# Patient Record
Sex: Female | Born: 1975 | Race: White | Hispanic: No | Smoking: Current some day smoker
Health system: Southern US, Community
[De-identification: ages and names within clinical notes are randomized; demographics above are authoritative.]

## PROBLEM LIST (undated history)

## (undated) DIAGNOSIS — F410 Panic disorder [episodic paroxysmal anxiety] without agoraphobia: Secondary | ICD-10-CM

## (undated) DIAGNOSIS — F4310 Post-traumatic stress disorder, unspecified: Secondary | ICD-10-CM

## (undated) DIAGNOSIS — F319 Bipolar disorder, unspecified: Secondary | ICD-10-CM

## (undated) DIAGNOSIS — G8929 Other chronic pain: Secondary | ICD-10-CM

## (undated) DIAGNOSIS — M549 Dorsalgia, unspecified: Secondary | ICD-10-CM

## (undated) DIAGNOSIS — F419 Anxiety disorder, unspecified: Secondary | ICD-10-CM

## (undated) DIAGNOSIS — F1120 Opioid dependence, uncomplicated: Secondary | ICD-10-CM

## (undated) HISTORY — PX: CHOLECYSTECTOMY: SHX55

## (undated) HISTORY — PX: APPENDECTOMY: SHX54

## (undated) HISTORY — PX: ABDOMINAL HYSTERECTOMY: SHX81

---

## 1998-06-04 ENCOUNTER — Other Ambulatory Visit: Admission: RE | Admit: 1998-06-04 | Discharge: 1998-06-04 | Payer: Self-pay | Admitting: Obstetrics and Gynecology

## 1998-10-25 ENCOUNTER — Ambulatory Visit (HOSPITAL_COMMUNITY): Admission: RE | Admit: 1998-10-25 | Discharge: 1998-10-25 | Payer: Self-pay | Admitting: Obstetrics and Gynecology

## 2001-07-18 ENCOUNTER — Other Ambulatory Visit: Admission: RE | Admit: 2001-07-18 | Discharge: 2001-07-18 | Payer: Self-pay | Admitting: Family Medicine

## 2012-05-07 ENCOUNTER — Encounter (HOSPITAL_BASED_OUTPATIENT_CLINIC_OR_DEPARTMENT_OTHER): Payer: Self-pay | Admitting: *Deleted

## 2012-05-07 ENCOUNTER — Emergency Department (HOSPITAL_BASED_OUTPATIENT_CLINIC_OR_DEPARTMENT_OTHER)
Admission: EM | Admit: 2012-05-07 | Discharge: 2012-05-07 | Disposition: A | Discharge status: Home / Self Care | Payer: Self-pay | Attending: Emergency Medicine | Admitting: Emergency Medicine

## 2012-05-07 DIAGNOSIS — N390 Urinary tract infection, site not specified: Secondary | ICD-10-CM | POA: Insufficient documentation

## 2012-05-07 DIAGNOSIS — F17200 Nicotine dependence, unspecified, uncomplicated: Secondary | ICD-10-CM | POA: Insufficient documentation

## 2012-05-07 DIAGNOSIS — Z3202 Encounter for pregnancy test, result negative: Secondary | ICD-10-CM | POA: Insufficient documentation

## 2012-05-07 DIAGNOSIS — Z90710 Acquired absence of both cervix and uterus: Secondary | ICD-10-CM | POA: Insufficient documentation

## 2012-05-07 LAB — URINALYSIS, ROUTINE W REFLEX MICROSCOPIC
Bilirubin Urine: NEGATIVE
Nitrite: POSITIVE — AB
Specific Gravity, Urine: 1.017 (ref 1.005–1.030)
Urobilinogen, UA: 2 mg/dL — ABNORMAL HIGH (ref 0.0–1.0)

## 2012-05-07 LAB — URINE MICROSCOPIC-ADD ON

## 2012-05-07 MED ORDER — OXYCODONE-ACETAMINOPHEN 5-325 MG PO TABS
2.0000 | ORAL_TABLET | Freq: Once | ORAL | Status: AC
  Administered 2012-05-07: 2 via ORAL
  Filled 2012-05-07 (×2): qty 2

## 2012-05-07 MED ORDER — HYDROCODONE-ACETAMINOPHEN 5-325 MG PO TABS: 2.0000 | ORAL_TABLET | ORAL | Status: DC | PRN

## 2012-05-07 MED ORDER — CIPROFLOXACIN HCL 500 MG PO TABS: 500.0000 mg | ORAL_TABLET | Freq: Two times a day (BID) | ORAL | Status: DC

## 2012-05-07 NOTE — ED Notes (Addendum)
The patient stated that she took Phenazopyridine HCI  To help with the her discomfort. Patient states it started last night, burning when urinating, lower back pain, pelvic pain even not when urinating

## 2012-05-07 NOTE — ED Provider Notes (Signed)
History     CSN: [id number]  Arrival date & time 05/07/12  4098   First MD Initiated Contact with Patient 05/07/12 [phone]      Chief Complaint  Patient presents with  . Urinary Tract Infection    (Consider location/radiation/quality/duration/timing/severity/associated sxs/prior treatment) HPI Comments: Patient presents with a two-day history of lower gone on discomfort and burning on urination. She states hours through the night. She denies any vaginal bleeding or discharge. She denies any fevers or chills. She's had some nausea but no vomiting. She has a history of UTIs in the past and says it is still similar. She says the pain is radiating to her lower back bilaterally. She describes as a constant throbbing pain.   History reviewed. No pertinent past medical history.  Past Surgical History  Procedure Date  . Cesarean section   . Abdominal hysterectomy     No family history on file.  History  Substance Use Topics  . Smoking status: Current Some Day Smoker  . Smokeless tobacco: Not on file  . Alcohol Use: No    OB History    Grav Para Term Preterm Abortions TAB SAB Ect Mult Living                  Review of Systems  Constitutional: Negative for fever, chills, diaphoresis and fatigue.  HENT: Negative for congestion, rhinorrhea and sneezing.   Eyes: Negative.   Respiratory: Negative for cough, chest tightness and shortness of breath.   Cardiovascular: Negative for chest pain and leg swelling.  Gastrointestinal: Positive for nausea and abdominal pain. Negative for vomiting, diarrhea and blood in stool.  Genitourinary: Positive for dysuria, urgency, frequency and flank pain. Negative for hematuria, vaginal bleeding, vaginal discharge and difficulty urinating.  Musculoskeletal: Negative for back pain and arthralgias.  Skin: Negative for rash.  Neurological: Negative for dizziness, speech difficulty, weakness, numbness and headaches.    Allergies  Review of patient's  allergies indicates no known allergies.  Home Medications   Current Outpatient Rx  Name  Route  Sig  Dispense  Refill  . CIPROFLOXACIN HCL 500 MG PO TABS   Oral   Take 1 tablet (500 mg total) by mouth 2 (two) times daily.   14 tablet   0   . HYDROCODONE-ACETAMINOPHEN 5-325 MG PO TABS   Oral   Take 2 tablets by mouth every 4 (four) hours as needed for pain.   15 tablet   0     BP 118/70  Pulse 82  Temp 97.7 F (36.5 C) (Oral)  Resp 20  Ht 5\' 6"  (1.676 m)  Wt 122 lb (55.339 kg)  BMI 19.69 kg/m2  SpO2 100%  Physical Exam  Constitutional: She is oriented to person, place, and time. She appears well-developed and well-nourished.  HENT:  Head: Normocephalic and atraumatic.  Eyes: Pupils are equal, round, and reactive to light.  Neck: Normal range of motion. Neck supple.  Cardiovascular: Normal rate, regular rhythm and normal heart sounds.   Pulmonary/Chest: Effort normal and breath sounds normal. No respiratory distress. She has no wheezes. She has no rales. She exhibits no tenderness.  Abdominal: Soft. Bowel sounds are normal. There is tenderness (Mild tenderness in the suprapubic region, no CVA tenderness). There is no rebound and no guarding.  Musculoskeletal: Normal range of motion. She exhibits no edema.  Lymphadenopathy:    She has no cervical adenopathy.  Neurological: She is alert and oriented to person, place, and time.  Skin: Skin is warm  and dry. No rash noted.  Psychiatric: She has a normal mood and affect.    ED Course  Procedures (including critical care time)  Results for orders placed during the hospital encounter of 05/07/12  URINALYSIS, ROUTINE W REFLEX MICROSCOPIC      Component Value Range   Color, Urine ORANGE (*) YELLOW   APPearance CLOUDY (*) CLEAR   Specific Gravity, Urine 1.017  1.005 - 1.030   pH 5.0  5.0 - 8.0   Glucose, UA NEGATIVE  NEGATIVE mg/dL   Hgb urine dipstick NEGATIVE  NEGATIVE   Bilirubin Urine NEGATIVE  NEGATIVE   Ketones,  ur 15 (*) NEGATIVE mg/dL   Protein, ur NEGATIVE  NEGATIVE mg/dL   Urobilinogen, UA 2.0 (*) 0.0 - 1.0 mg/dL   Nitrite POSITIVE (*) NEGATIVE   Leukocytes, UA LARGE (*) NEGATIVE  PREGNANCY, URINE      Component Value Range   Preg Test, Ur NEGATIVE  NEGATIVE  URINE MICROSCOPIC-ADD ON      Component Value Range   Squamous Epithelial / LPF FEW (*) RARE   WBC, UA TOO NUMEROUS TO COUNT  <3 WBC/hpf   RBC / HPF 0-2  <3 RBC/hpf   Bacteria, UA MANY (*) RARE   No results found.    1. UTI (lower urinary tract infection)       MDM  Patient with UTI. Will start on Cipro and Vicodin for pain. Advised her to followup with her primary care physician or return here as needed for any ongoing or worsening symptoms.        Rolan Bucco, MD 05/07/12 [phone]

## 2012-05-09 ENCOUNTER — Emergency Department (HOSPITAL_BASED_OUTPATIENT_CLINIC_OR_DEPARTMENT_OTHER)
Admission: EM | Admit: 2012-05-09 | Discharge: 2012-05-09 | Disposition: A | Discharge status: Home / Self Care | Payer: Self-pay | Attending: Emergency Medicine | Admitting: Emergency Medicine

## 2012-05-09 ENCOUNTER — Encounter (HOSPITAL_BASED_OUTPATIENT_CLINIC_OR_DEPARTMENT_OTHER): Payer: Self-pay | Admitting: *Deleted

## 2012-05-09 DIAGNOSIS — R112 Nausea with vomiting, unspecified: Secondary | ICD-10-CM | POA: Insufficient documentation

## 2012-05-09 DIAGNOSIS — Z87440 Personal history of urinary (tract) infections: Secondary | ICD-10-CM | POA: Insufficient documentation

## 2012-05-09 DIAGNOSIS — F17200 Nicotine dependence, unspecified, uncomplicated: Secondary | ICD-10-CM | POA: Insufficient documentation

## 2012-05-09 DIAGNOSIS — M545 Low back pain, unspecified: Secondary | ICD-10-CM | POA: Insufficient documentation

## 2012-05-09 DIAGNOSIS — G8929 Other chronic pain: Secondary | ICD-10-CM | POA: Insufficient documentation

## 2012-05-09 DIAGNOSIS — Z90710 Acquired absence of both cervix and uterus: Secondary | ICD-10-CM | POA: Insufficient documentation

## 2012-05-09 DIAGNOSIS — Z3202 Encounter for pregnancy test, result negative: Secondary | ICD-10-CM | POA: Insufficient documentation

## 2012-05-09 LAB — URINE CULTURE
Colony Count: NO GROWTH
Culture: NO GROWTH

## 2012-05-09 LAB — URINALYSIS, ROUTINE W REFLEX MICROSCOPIC
Bilirubin Urine: NEGATIVE
Hgb urine dipstick: NEGATIVE
Ketones, ur: NEGATIVE mg/dL
Protein, ur: NEGATIVE mg/dL
Urobilinogen, UA: 0.2 mg/dL (ref 0.0–1.0)

## 2012-05-09 LAB — PREGNANCY, URINE: Preg Test, Ur: NEGATIVE

## 2012-05-09 MED ORDER — HYDROCODONE-ACETAMINOPHEN 5-325 MG PO TABS: 1.0000 | ORAL_TABLET | Freq: Four times a day (QID) | ORAL | Status: DC | PRN

## 2012-05-09 NOTE — ED Notes (Signed)
Pt. Reports being seen here 2 days ago and received meds for urinary tract infection and pain meds.  Pt. Reports being out of pain meds with c/o low back pain and had to leave work early today.

## 2012-05-09 NOTE — ED Provider Notes (Signed)
History   This chart was scribed for Hurman Horn, MD by Leone Payor, ED Scribe. This patient was seen in room MHOTF/OTF and the patient's care was started at 1616.   CSN: [id number]  Arrival date & time 05/09/12  1444   First MD Initiated Contact with Patient 05/09/12 1616      Chief Complaint  Patient presents with  . Back Pain     The history is provided by the patient. No language interpreter was used.    Beth Best is a 37 y.o. female who presents to the Emergency Department complaining of unchanged, constant back pain that is associated with recurrent UTI's starting 6 months ago. Was seen here a few days ago and was treated for a UTI. She states she has a h/o of UTI's. Back pain is worse with movement and sometimes radiates to the back of legs but does not go past knees and sometimes radiates to neck. It is worsened with position changes and regular UTI's. Pt is currently on Ciprofloxacin. She has associated vomiting, nausea. She denies rash, diarrhea, chest pain, SOB, vaginal pain, vaginal bleeding, visual changes, change in bowel or bladder function.    Pt has h/o abdominal hysterectomy.  Pt is a current everyday smoker but denies alcohol use.  History reviewed. No pertinent past medical history.  Past Surgical History  Procedure Date  . Cesarean section   . Abdominal hysterectomy     No family history on file.  History  Substance Use Topics  . Smoking status: Current Some Day Smoker  . Smokeless tobacco: Not on file  . Alcohol Use: No    OB History    Grav Para Term Preterm Abortions TAB SAB Ect Mult Living                  Review of Systems  10 Systems reviewed and all are negative for acute change except as noted in the HPI.    Allergies  Review of patient's allergies indicates no known allergies.  Home Medications   Current Outpatient Rx  Name  Route  Sig  Dispense  Refill  . CIPROFLOXACIN HCL 500 MG PO TABS   Oral   Take 1 tablet (500  mg total) by mouth 2 (two) times daily.   14 tablet   0   . HYDROCODONE-ACETAMINOPHEN 5-325 MG PO TABS   Oral   Take 1 tablet by mouth every 6 (six) hours as needed for pain.   12 tablet   0     BP 123/83  Pulse 91  Temp 98.4 F (36.9 C) (Oral)  Resp 20  SpO2 100%  Physical Exam  Nursing note and vitals reviewed. Constitutional:       Awake, alert, nontoxic appearance with baseline speech.  HENT:  Head: Atraumatic.  Eyes: Pupils are equal, round, and reactive to light. Right eye exhibits no discharge. Left eye exhibits no discharge.  Neck: Neck supple.  Cardiovascular: Normal rate and regular rhythm.   No murmur heard. Pulmonary/Chest: Effort normal and breath sounds normal. No respiratory distress. She has no wheezes. She has no rales. She exhibits no tenderness.  Abdominal: Soft. Bowel sounds are normal. She exhibits no mass. There is no tenderness. There is no rebound.  Musculoskeletal:       Thoracic back: She exhibits no tenderness.       Lumbar back: She exhibits no tenderness.       Bilateral lower extremities non tender without new rashes or  color change, baseline ROM with intact DP / PT pulses, CR<2 secs all digits bilaterally, sensation baseline light touch bilaterally for pt, DTR's symmetric and intact bilaterally KJ / AJ, motor symmetric bilateral 5 / 5 hip flexion, quadriceps, hamstrings, EHL, foot dorsiflexion, foot plantarflexion, gait somewhat antalgic but without apparent new ataxia.  Diffuse bilateral para lumbar tenderness with palpation. Not CVA tenderness.   Neurological:       Mental status baseline for patient.  Upper extremity motor strength and sensation intact and symmetric bilaterally.  Skin: No rash noted.  Psychiatric: She has a normal mood and affect.    ED Course  Procedures (including critical care time)  DIAGNOSTIC STUDIES: Oxygen Saturation is 100% on room air, normal by my interpretation.    COORDINATION OF CARE:  4:27PM Patient /  Family / Caregiver understand and agree with initial ED impression and plan with expectations set for ED visit.   Labs Reviewed  URINALYSIS, ROUTINE W REFLEX MICROSCOPIC - Abnormal; Notable for the following:    Specific Gravity, Urine 1.003 (*)     All other components within normal limits  PREGNANCY, URINE   No results found.   1. Chronic lumbar pain       MDM  I personally performed the services described in this documentation, which was scribed in my presence. The recorded information has been reviewed and is accurate.  Patient / Family / Caregiver informed of clinical course, understand medical decision-making process, and agree with plan.  I doubt any other EMC precluding discharge at this time including, but not necessarily limited to the following: SBI, cauda equina.  Hurman Horn, MD 05/10/12 [phone]

## 2012-05-09 NOTE — ED Notes (Signed)
Was here a few days ago and treated for a UTI. Pain is no better.

## 2012-05-12 ENCOUNTER — Emergency Department (HOSPITAL_BASED_OUTPATIENT_CLINIC_OR_DEPARTMENT_OTHER)
Admission: EM | Admit: 2012-05-12 | Discharge: 2012-05-12 | Disposition: A | Discharge status: Home / Self Care | Payer: Self-pay | Attending: Emergency Medicine | Admitting: Emergency Medicine

## 2012-05-12 ENCOUNTER — Encounter (HOSPITAL_BASED_OUTPATIENT_CLINIC_OR_DEPARTMENT_OTHER): Payer: Self-pay | Admitting: *Deleted

## 2012-05-12 DIAGNOSIS — F17200 Nicotine dependence, unspecified, uncomplicated: Secondary | ICD-10-CM | POA: Insufficient documentation

## 2012-05-12 DIAGNOSIS — K649 Unspecified hemorrhoids: Secondary | ICD-10-CM | POA: Insufficient documentation

## 2012-05-12 MED ORDER — HYDROCORTISONE ACE-PRAMOXINE 1-1 % RE FOAM: 1.0000 | Freq: Two times a day (BID) | RECTAL | Status: DC

## 2012-05-12 NOTE — ED Provider Notes (Signed)
History     CSN: [id number]  Arrival date & time 05/12/12  1721   First MD Initiated Contact with Patient 05/12/12 1810      Chief Complaint  Patient presents with  . Hemorrhoids    (Consider location/radiation/quality/duration/timing/severity/associated sxs/prior treatment) HPI Comments: Pt states that she has had a hemorrhoid for 3 days:pt states that she has no history:pt denies fever:pt has not tried any medications  The history is provided by the patient. No language interpreter was used.    History reviewed. No pertinent past medical history.  Past Surgical History  Procedure Date  . Cesarean section   . Abdominal hysterectomy     History reviewed. No pertinent family history.  History  Substance Use Topics  . Smoking status: Current Some Day Smoker  . Smokeless tobacco: Not on file  . Alcohol Use: No    OB History    Grav Para Term Preterm Abortions TAB SAB Ect Mult Living                  Review of Systems  Constitutional: Negative.   Respiratory: Negative.   Cardiovascular: Negative.     Allergies  Review of patient's allergies indicates no known allergies.  Home Medications   Current Outpatient Rx  Name  Route  Sig  Dispense  Refill  . CIPROFLOXACIN HCL 500 MG PO TABS   Oral   Take 1 tablet (500 mg total) by mouth 2 (two) times daily.   14 tablet   0   . HYDROCODONE-ACETAMINOPHEN 5-325 MG PO TABS   Oral   Take 1 tablet by mouth every 6 (six) hours as needed for pain.   12 tablet   0   . HYDROCORTISONE ACE-PRAMOXINE 1-1 % RE FOAM   Rectal   Place 1 applicator rectally 2 (two) times daily.   10 g   0     BP 124/74  Pulse 85  Temp 98.8 F (37.1 C) (Oral)  Resp 16  Ht 5\' 6"  (1.676 m)  Wt 122 lb (55.339 kg)  BMI 19.69 kg/m2  SpO2 100%  Physical Exam  Nursing note and vitals reviewed. Cardiovascular: Normal rate and regular rhythm.   Pulmonary/Chest: Effort normal and breath sounds normal.  Genitourinary:       hemorrhoid  noted    ED Course  Procedures (including critical care time)  Labs Reviewed - No data to display No results found.   1. Hemorrhoid       MDM  Will treat with protofoam        Teressa Lower, NP 05/12/12 1926

## 2012-05-12 NOTE — ED Notes (Signed)
Pt c/o Hemorids  X 3 days

## 2012-05-13 NOTE — ED Provider Notes (Signed)
Medical screening examination/treatment/procedure(s) were performed by non-physician practitioner and as supervising physician I was immediately available for consultation/collaboration.     Manvi Guilliams R Sanya Kobrin, MD 05/13/12 0002 

## 2012-07-04 ENCOUNTER — Encounter (HOSPITAL_BASED_OUTPATIENT_CLINIC_OR_DEPARTMENT_OTHER): Payer: Self-pay | Admitting: *Deleted

## 2012-07-04 ENCOUNTER — Emergency Department (HOSPITAL_BASED_OUTPATIENT_CLINIC_OR_DEPARTMENT_OTHER)
Admission: EM | Admit: 2012-07-04 | Discharge: 2012-07-04 | Disposition: A | Discharge status: Home / Self Care | Payer: Self-pay | Attending: Emergency Medicine | Admitting: Emergency Medicine

## 2012-07-04 ENCOUNTER — Emergency Department (HOSPITAL_BASED_OUTPATIENT_CLINIC_OR_DEPARTMENT_OTHER): Payer: Self-pay

## 2012-07-04 DIAGNOSIS — M25531 Pain in right wrist: Secondary | ICD-10-CM

## 2012-07-04 DIAGNOSIS — R296 Repeated falls: Secondary | ICD-10-CM | POA: Insufficient documentation

## 2012-07-04 DIAGNOSIS — Y929 Unspecified place or not applicable: Secondary | ICD-10-CM | POA: Insufficient documentation

## 2012-07-04 DIAGNOSIS — S46909A Unspecified injury of unspecified muscle, fascia and tendon at shoulder and upper arm level, unspecified arm, initial encounter: Secondary | ICD-10-CM | POA: Insufficient documentation

## 2012-07-04 DIAGNOSIS — F17200 Nicotine dependence, unspecified, uncomplicated: Secondary | ICD-10-CM | POA: Insufficient documentation

## 2012-07-04 DIAGNOSIS — M25511 Pain in right shoulder: Secondary | ICD-10-CM

## 2012-07-04 DIAGNOSIS — S6990XA Unspecified injury of unspecified wrist, hand and finger(s), initial encounter: Secondary | ICD-10-CM | POA: Insufficient documentation

## 2012-07-04 DIAGNOSIS — Y9389 Activity, other specified: Secondary | ICD-10-CM | POA: Insufficient documentation

## 2012-07-04 DIAGNOSIS — S59909A Unspecified injury of unspecified elbow, initial encounter: Secondary | ICD-10-CM | POA: Insufficient documentation

## 2012-07-04 MED ORDER — IBUPROFEN 800 MG PO TABS
800.0000 mg | ORAL_TABLET | Freq: Once | ORAL | Status: AC
  Administered 2012-07-04: 800 mg via ORAL
  Filled 2012-07-04: qty 1

## 2012-07-04 NOTE — ED Notes (Signed)
Fell in the tub 2 days ago. Pain in her right wrist and right shoulder. Drove herself here.

## 2012-07-04 NOTE — ED Provider Notes (Signed)
History     CSN: [id number]  Arrival date & time 07/04/12  1041   First MD Initiated Contact with Patient 07/04/12 1047      Chief Complaint  Patient presents with  . Fall    (Consider location/radiation/quality/duration/timing/severity/associated sxs/prior treatment) HPI  Patient states she fell approximately 36 hours prior to coming into the ED. She states she fell in the bathtub. She denied strike her head. She has pain in her right wrist and right shoulder. She describes the pain as severe. She has taken over-the-counter medicine without relief. Denies any numbness or tingling. She denies any vision changes, chest pain, abdominal pain, nausea, vomiting, diarrhea, or lower extremity pain. She is right-handed. She describes pain worsening with extension of the right wrist.  History reviewed. No pertinent past medical history.  Past Surgical History  Procedure Laterality Date  . Cesarean section    . Abdominal hysterectomy      History reviewed. No pertinent family history.  History  Substance Use Topics  . Smoking status: Current Some Day Smoker    Types: Cigarettes  . Smokeless tobacco: Not on file  . Alcohol Use: No    OB History   Grav Para Term Preterm Abortions TAB SAB Ect Mult Living                  Review of Systems  All other systems reviewed and are negative.    Allergies  Review of patient's allergies indicates no known allergies.  Home Medications   Current Outpatient Rx  Name  Route  Sig  Dispense  Refill  . ciprofloxacin (CIPRO) 500 MG tablet   Oral   Take 1 tablet (500 mg total) by mouth 2 (two) times daily.   14 tablet   0   . HYDROcodone-acetaminophen (NORCO/VICODIN) 5-325 MG per tablet   Oral   Take 1 tablet by mouth every 6 (six) hours as needed for pain.   12 tablet   0   . hydrocortisone-pramoxine (PROCTOFOAM HC) rectal foam   Rectal   Place 1 applicator rectally 2 (two) times daily.   10 g   0     BP 145/95  Pulse 98   Temp(Src) 98.3 F (36.8 C) (Oral)  Resp 18  SpO2 100%  Physical Exam  Nursing note and vitals reviewed. Constitutional: She is oriented to person, place, and time. She appears well-developed and well-nourished.  HENT:  Head: Normocephalic and atraumatic.  Eyes: Conjunctivae and EOM are normal. Pupils are equal, round, and reactive to light.  Neck: Normal range of motion. Neck supple.  Cardiovascular: Normal rate, regular rhythm, normal heart sounds and intact distal pulses.   Pulmonary/Chest: Effort normal and breath sounds normal.  Abdominal: Soft. Bowel sounds are normal.  Musculoskeletal: Normal range of motion.  Tender right wrist on the dorsal aspect. Tender to the lateral aspect of the right elbow. She has some posterior tenderness of her right shoulder. She also has some tenderness over her cervical spine. She has full active range of movement of all of the above. There is no neurosensory deficit. Pulses are intact. The skin is intact with no signs of contusion or trauma.  Neurological: She is alert and oriented to person, place, and time.  Skin: Skin is warm and dry.  Psychiatric: She has a normal mood and affect. Thought content normal.    ED Course  Procedures (including critical care time)  Labs Reviewed - No data to display Dg Cervical Spine Complete  07/04/2012  *  RADIOLOGY REPORT*  Clinical Data: Fall  CERVICAL SPINE - COMPLETE 4+ VIEW  Comparison: None.  Findings: Normal alignment and no fracture.  Mild disc space narrowing and early spurring C5-6 and C6-7.  IMPRESSION: Negative for fracture.   Original Report Authenticated By: Janeece Riggers, M.D.    Dg Shoulder Right  07/04/2012  *RADIOLOGY REPORT*  Clinical Data: Fall  RIGHT SHOULDER - 2+ VIEW  Comparison:  None.  Findings:  There is no evidence of fracture or dislocation.  There is no evidence of arthropathy or other focal bone abnormality. Soft tissues are unremarkable.  IMPRESSION: Negative.   Original Report  Authenticated By: Janeece Riggers, M.D.    Dg Elbow Complete Right  07/04/2012  *RADIOLOGY REPORT*  Clinical Data: Fall  RIGHT ELBOW - COMPLETE 3+ VIEW  Comparison:  None.  Findings:  There is no evidence of fracture, dislocation, or joint effusion.  There is no evidence of arthropathy or other focal bone abnormality.  Soft tissues are unremarkable.  IMPRESSION: Negative.   Original Report Authenticated By: Janeece Riggers, M.D.    Dg Wrist Complete Right  07/04/2012  *RADIOLOGY REPORT*  Clinical Data: Fall  RIGHT WRIST - COMPLETE 3+ VIEW  Comparison:  None.  Findings:  There is no evidence of fracture or dislocation.  There is no evidence of arthropathy or other focal bone abnormality. Soft tissues are unremarkable.  IMPRESSION: Negative.   Original Report Authenticated By: Janeece Riggers, M.D.      No diagnosis found.    MDM  Patient will have left wrist splint placed. She is given referral to Dr. Pearletha Forge. She had two pain medicine prescriptions dispensed in January for ed complaints. I have discussed conservative pain management treatment with her. She'll be given a referral to followup for followup       Hilario Quarry, MD 07/04/12 1243

## 2012-08-05 ENCOUNTER — Emergency Department (HOSPITAL_BASED_OUTPATIENT_CLINIC_OR_DEPARTMENT_OTHER): Payer: Self-pay

## 2012-08-05 ENCOUNTER — Encounter (HOSPITAL_BASED_OUTPATIENT_CLINIC_OR_DEPARTMENT_OTHER): Payer: Self-pay | Admitting: Emergency Medicine

## 2012-08-05 ENCOUNTER — Emergency Department (HOSPITAL_BASED_OUTPATIENT_CLINIC_OR_DEPARTMENT_OTHER)
Admission: EM | Admit: 2012-08-05 | Discharge: 2012-08-05 | Disposition: A | Discharge status: Home / Self Care | Payer: Self-pay | Attending: Emergency Medicine | Admitting: Emergency Medicine

## 2012-08-05 DIAGNOSIS — W1809XA Striking against other object with subsequent fall, initial encounter: Secondary | ICD-10-CM | POA: Insufficient documentation

## 2012-08-05 DIAGNOSIS — G8929 Other chronic pain: Secondary | ICD-10-CM | POA: Insufficient documentation

## 2012-08-05 DIAGNOSIS — W19XXXA Unspecified fall, initial encounter: Secondary | ICD-10-CM | POA: Insufficient documentation

## 2012-08-05 DIAGNOSIS — Y92009 Unspecified place in unspecified non-institutional (private) residence as the place of occurrence of the external cause: Secondary | ICD-10-CM | POA: Insufficient documentation

## 2012-08-05 DIAGNOSIS — F410 Panic disorder [episodic paroxysmal anxiety] without agoraphobia: Secondary | ICD-10-CM | POA: Insufficient documentation

## 2012-08-05 DIAGNOSIS — F17200 Nicotine dependence, unspecified, uncomplicated: Secondary | ICD-10-CM | POA: Insufficient documentation

## 2012-08-05 DIAGNOSIS — Y9301 Activity, walking, marching and hiking: Secondary | ICD-10-CM | POA: Insufficient documentation

## 2012-08-05 DIAGNOSIS — Z79899 Other long term (current) drug therapy: Secondary | ICD-10-CM | POA: Insufficient documentation

## 2012-08-05 DIAGNOSIS — S0990XA Unspecified injury of head, initial encounter: Secondary | ICD-10-CM | POA: Insufficient documentation

## 2012-08-05 DIAGNOSIS — R42 Dizziness and giddiness: Secondary | ICD-10-CM | POA: Insufficient documentation

## 2012-08-05 HISTORY — DX: Panic disorder (episodic paroxysmal anxiety): F41.0

## 2012-08-05 HISTORY — DX: Dorsalgia, unspecified: M54.9

## 2012-08-05 HISTORY — DX: Anxiety disorder, unspecified: F41.9

## 2012-08-05 HISTORY — DX: Other chronic pain: G89.29

## 2012-08-05 LAB — CBC WITH DIFFERENTIAL/PLATELET
Basophils Absolute: 0 10*3/uL (ref 0.0–0.1)
Eosinophils Absolute: 0.2 10*3/uL (ref 0.0–0.7)
Lymphocytes Relative: 31 % (ref 12–46)
Lymphs Abs: 2.6 10*3/uL (ref 0.7–4.0)
MCH: 31.6 pg (ref 26.0–34.0)
Neutrophils Relative %: 58 % (ref 43–77)
Platelets: 161 10*3/uL (ref 150–400)
RBC: 4.3 MIL/uL (ref 3.87–5.11)
RDW: 12.1 % (ref 11.5–15.5)
WBC: 8.4 10*3/uL (ref 4.0–10.5)

## 2012-08-05 LAB — RAPID URINE DRUG SCREEN, HOSP PERFORMED
Amphetamines: NOT DETECTED
Barbiturates: NOT DETECTED
Benzodiazepines: POSITIVE — AB
Cocaine: NOT DETECTED

## 2012-08-05 LAB — BASIC METABOLIC PANEL
Calcium: 9 mg/dL (ref 8.4–10.5)
Creatinine, Ser: 0.8 mg/dL (ref 0.50–1.10)
GFR calc non Af Amer: >90 mL/min (ref >90)
Sodium: 139 mEq/L (ref 135–145)

## 2012-08-05 LAB — URINALYSIS, ROUTINE W REFLEX MICROSCOPIC
Bilirubin Urine: NEGATIVE
Glucose, UA: NEGATIVE mg/dL
Hgb urine dipstick: NEGATIVE
Ketones, ur: NEGATIVE mg/dL
pH: 5 (ref 5.0–8.0)

## 2012-08-05 LAB — URINE MICROSCOPIC-ADD ON

## 2012-08-05 MED ORDER — KETOROLAC TROMETHAMINE 30 MG/ML IJ SOLN
30.0000 mg | Freq: Once | INTRAMUSCULAR | Status: AC
  Administered 2012-08-05: 30 mg via INTRAVENOUS
  Filled 2012-08-05: qty 1

## 2012-08-05 NOTE — ED Notes (Signed)
Patient and female visitor in room appear to be sedated, sleepy with red glassy eye.  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

## 2012-08-05 NOTE — ED Notes (Signed)
Pt. thinks she fell and hit her head after having a panic attack.  Pt c/o h/a.  Difficult to obtain information from pt due to her slow response.  Fiance with pt. states he found her "flat on her back on the floor around 12:45".  He thought she was having a seizure.  He placed his finger in her mouth and "she came around some, but hasn't made much sense".

## 2012-08-05 NOTE — ED Provider Notes (Signed)
History     CSN: [id number]  Arrival date & time 08/05/12  1403   First MD Initiated Contact with Patient 08/05/12 1416      Chief Complaint  Patient presents with  . Fall  . Panic Attack    (Consider location/radiation/quality/duration/timing/severity/associated sxs/prior treatment) HPI Comments: Pt state that she got dizzy and fell in her living:pt states that she hit her head:boyfriend states that he heard her fall and then he went in the room and she was flat on her back:pt states that she is able to ambulate after fall but just has a bad headache:pt states that she remembers the whole event  Patient is a 37 y.o. female presenting with fall. The history is provided by the patient. No language interpreter was used.  Fall The accident occurred 1 to 2 hours ago. The fall occurred while walking. She landed on a hard floor. The point of impact was the head. The pain is present in the head and neck. The pain is severe. She was ambulatory at the scene. There was no entrapment after the fall. There was no drug use involved in the accident. There was no alcohol use involved in the accident. Pertinent negatives include no visual change, no fever, no bowel incontinence, no nausea, no vomiting, no hematuria, no loss of consciousness and no tingling. The symptoms are aggravated by activity. She has tried nothing for the symptoms.    Past Medical History  Diagnosis Date  . Panic attacks   . Anxiety   . Chronic back pain     Past Surgical History  Procedure Laterality Date  . Cesarean section    . Abdominal hysterectomy      No family history on file.  History  Substance Use Topics  . Smoking status: Current Some Day Smoker    Types: Cigarettes  . Smokeless tobacco: Not on file  . Alcohol Use: No    OB History   Grav Para Term Preterm Abortions TAB SAB Ect Mult Living                  Review of Systems  Constitutional: Negative for fever.  Respiratory: Negative.    Cardiovascular: Negative.   Gastrointestinal: Negative for nausea, vomiting and bowel incontinence.  Genitourinary: Negative for hematuria.  Neurological: Negative for tingling and loss of consciousness.    Allergies  Review of patient's allergies indicates no known allergies.  Home Medications   Current Outpatient Rx  Name  Route  Sig  Dispense  Refill  . FLUoxetine (PROZAC) 20 MG tablet   Oral   Take 20 mg by mouth daily.         . ciprofloxacin (CIPRO) 500 MG tablet   Oral   Take 1 tablet (500 mg total) by mouth 2 (two) times daily.   14 tablet   0   . HYDROcodone-acetaminophen (NORCO/VICODIN) 5-325 MG per tablet   Oral   Take 1 tablet by mouth every 6 (six) hours as needed for pain.   12 tablet   0   . hydrocortisone-pramoxine (PROCTOFOAM HC) rectal foam   Rectal   Place 1 applicator rectally 2 (two) times daily.   10 g   0     BP 114/91  Pulse 97  Temp(Src) 98.2 F (36.8 C) (Oral)  Resp 16  Ht 5\' 6"  (1.676 m)  Wt 120 lb (54.432 kg)  BMI 19.38 kg/m2  SpO2 97%  Physical Exam  Vitals reviewed. Constitutional: She is oriented to person,  place, and time. She appears well-developed and well-nourished.  HENT:  Head: Normocephalic and atraumatic.  Eyes: Conjunctivae and EOM are normal. Pupils are equal, round, and reactive to light.  Neck: Normal range of motion. Neck supple.  Cardiovascular: Normal rate and regular rhythm.   Pulmonary/Chest: Effort normal and breath sounds normal.  Abdominal: Soft. Bowel sounds are normal. There is no tenderness.  Musculoskeletal: Normal range of motion.       Cervical back: She exhibits bony tenderness.       Thoracic back: Normal.       Lumbar back: Normal.  Neurological: She is alert and oriented to person, place, and time.  Pt is very sedated:but able to answer all questions without any problem  Skin: Skin is warm and dry.  Psychiatric: She has a normal mood and affect.    ED Course  Procedures (including  critical care time)  Labs Reviewed  URINALYSIS, ROUTINE W REFLEX MICROSCOPIC - Abnormal; Notable for the following:    Color, Urine ORANGE (*)    Nitrite POSITIVE (*)    All other components within normal limits  URINE RAPID DRUG SCREEN (HOSP PERFORMED) - Abnormal; Notable for the following:    Benzodiazepines POSITIVE (*)    All other components within normal limits  URINE CULTURE  BASIC METABOLIC PANEL  CBC WITH DIFFERENTIAL  URINE MICROSCOPIC-ADD ON   Dg Cervical Spine Complete  08/05/2012  *RADIOLOGY REPORT*  Clinical Data: Fall with injury.  CERVICAL SPINE - COMPLETE 4+ VIEW  Comparison: None.  Findings: No acute fracture or subluxation identified.  Mild spondylosis present at C5-6 and C6-7.  No soft tissue swelling identified.  IMPRESSION: No acute findings.  Mild cervical spondylosis.   Original Report Authenticated By: Irish Lack, M.D.    Ct Head Wo Contrast  08/05/2012  *RADIOLOGY REPORT*  Clinical Data: Found on floor, questionable seizure, lethargy, headache  CT HEAD WITHOUT CONTRAST  Technique:  Contiguous axial images were obtained from the base of the skull through the vertex without contrast.  Comparison: None.  Findings: The ventricular system is normal in size and configuration and the septum is in a normal midline position.  The fourth ventricle and basilar cisterns appear normal.  No hemorrhage, mass lesion, or acute infarction is seen.  On bone window images, no calvarial abnormality is noted.  IMPRESSION: Negative unenhanced CT of the brain.   Original Report Authenticated By: Dwyane Dee, M.D.      1. Fall at home, initial encounter   2. Head injury, initial encounter       MDM  Pt is no distress:pt able to ambulate without assistance:no acute injury noted to the head and neck:pt has narcotics at home:will not give any more at this time        Teressa Lower, NP 08/05/12 1554

## 2012-08-06 LAB — URINE CULTURE

## 2012-08-07 NOTE — ED Provider Notes (Signed)
Medical screening examination/treatment/procedure(s) were performed by non-physician practitioner and as supervising physician I was immediately available for consultation/collaboration.  Temisha Murley, MD 08/07/12 0538 

## 2012-08-08 ENCOUNTER — Encounter (HOSPITAL_BASED_OUTPATIENT_CLINIC_OR_DEPARTMENT_OTHER): Payer: Self-pay

## 2012-08-08 ENCOUNTER — Emergency Department (HOSPITAL_BASED_OUTPATIENT_CLINIC_OR_DEPARTMENT_OTHER)
Admission: EM | Admit: 2012-08-08 | Discharge: 2012-08-08 | Disposition: A | Discharge status: Home / Self Care | Payer: Self-pay | Attending: Emergency Medicine | Admitting: Emergency Medicine

## 2012-08-08 DIAGNOSIS — S0990XD Unspecified injury of head, subsequent encounter: Secondary | ICD-10-CM

## 2012-08-08 DIAGNOSIS — IMO0002 Reserved for concepts with insufficient information to code with codable children: Secondary | ICD-10-CM | POA: Insufficient documentation

## 2012-08-08 DIAGNOSIS — M549 Dorsalgia, unspecified: Secondary | ICD-10-CM | POA: Insufficient documentation

## 2012-08-08 DIAGNOSIS — W1809XA Striking against other object with subsequent fall, initial encounter: Secondary | ICD-10-CM | POA: Insufficient documentation

## 2012-08-08 DIAGNOSIS — Y9289 Other specified places as the place of occurrence of the external cause: Secondary | ICD-10-CM | POA: Insufficient documentation

## 2012-08-08 DIAGNOSIS — R55 Syncope and collapse: Secondary | ICD-10-CM | POA: Insufficient documentation

## 2012-08-08 DIAGNOSIS — F411 Generalized anxiety disorder: Secondary | ICD-10-CM | POA: Insufficient documentation

## 2012-08-08 DIAGNOSIS — S0990XA Unspecified injury of head, initial encounter: Secondary | ICD-10-CM | POA: Insufficient documentation

## 2012-08-08 DIAGNOSIS — Y9389 Activity, other specified: Secondary | ICD-10-CM | POA: Insufficient documentation

## 2012-08-08 DIAGNOSIS — G8929 Other chronic pain: Secondary | ICD-10-CM | POA: Insufficient documentation

## 2012-08-08 DIAGNOSIS — F419 Anxiety disorder, unspecified: Secondary | ICD-10-CM

## 2012-08-08 DIAGNOSIS — F17200 Nicotine dependence, unspecified, uncomplicated: Secondary | ICD-10-CM | POA: Insufficient documentation

## 2012-08-08 MED ORDER — HYDROCODONE-ACETAMINOPHEN 5-325 MG PO TABS
2.0000 | ORAL_TABLET | Freq: Once | ORAL | Status: AC
  Administered 2012-08-08: 2 via ORAL
  Filled 2012-08-08: qty 2

## 2012-08-08 MED ORDER — LORAZEPAM 1 MG PO TABS
1.0000 mg | ORAL_TABLET | Freq: Once | ORAL | Status: AC
  Administered 2012-08-08: 1 mg via ORAL
  Filled 2012-08-08: qty 1

## 2012-08-08 NOTE — ED Provider Notes (Signed)
History     CSN: [id number]  Arrival date & time 08/08/12  0134   First MD Initiated Contact with Patient 08/08/12 0148      Chief Complaint  Patient presents with  . Seizures  Fainting with panic and anxiety  (Consider location/radiation/quality/duration/timing/severity/associated sxs/prior treatment) HPI This 37 year old female has a history of anxiety panic attacks chronic low back pain denies chronic narcotics states she is on benzodiazepines for anxiety, states she's been having multiple episodes of anxiety and panic attacks over the last few months worsening the last few days with apparently 3 episodes of fainting while having panic attacks, she states she will feel very anxious feel weak all over lightheaded feel as if she is having palpitations and heart racing feels that she is going to faint and then has brief witnessed syncopal spells with apparently some possible myoclonic jerking and then wakes up without a postictal phase or any tongue biting, she was seen within the last few days already in the emergency department and had an unremarkable head CT as well as cervical spine CT scan for the same type of symptoms, she had unremarkable CBC and basic metabolic panel is oriented hysterectomy, she denies suicidal or homicidal ideation or hallucinations, she denies chest pain shortness breath abdominal pain vomiting, she denies any change in speech or vision swallowing or understanding or focal or lateralizing weakness numbness or incoordination, she is stable chronic severe low back pain, she has had a continuous headache for the last few days since having a recent fainting spell and hitting her head and had 2 more spells tonight but does not take blood thinners. She ran out of her hydrocodone prescribed from the ED a few days ago. She denies midline cervical spine pain she denies any new lumbar pain. Past Medical History  Diagnosis Date  . Panic attacks   . Anxiety   . Chronic back pain      Past Surgical History  Procedure Laterality Date  . Cesarean section    . Abdominal hysterectomy    . Cholecystectomy      No family history on file.  History  Substance Use Topics  . Smoking status: Current Some Day Smoker    Types: Cigarettes  . Smokeless tobacco: Not on file  . Alcohol Use: No    OB History   Grav Para Term Preterm Abortions TAB SAB Ect Mult Living                  Review of Systems 10 Systems reviewed and are negative for acute change except as noted in the HPI. Allergies  Review of patient's allergies indicates no known allergies.  Home Medications   Current Outpatient Rx  Name  Route  Sig  Dispense  Refill  . ciprofloxacin (CIPRO) 500 MG tablet   Oral   Take 1 tablet (500 mg total) by mouth 2 (two) times daily.   14 tablet   0   . FLUoxetine (PROZAC) 20 MG tablet   Oral   Take 20 mg by mouth daily.         Marland Kitchen HYDROcodone-acetaminophen (NORCO/VICODIN) 5-325 MG per tablet   Oral   Take 1 tablet by mouth every 6 (six) hours as needed for pain.   12 tablet   0   . hydrocortisone-pramoxine (PROCTOFOAM HC) rectal foam   Rectal   Place 1 applicator rectally 2 (two) times daily.   10 g   0     There were no vitals  taken for this visit.  Physical Exam  Nursing note and vitals reviewed. Constitutional:  Awake, alert, nontoxic appearance with baseline speech for patient.  HENT:  Head: Atraumatic.  Mouth/Throat: No oropharyngeal exudate.  Eyes: EOM are normal. Pupils are equal, round, and reactive to light. Right eye exhibits no discharge. Left eye exhibits no discharge.  Neck: Neck supple.  No midline cervical tenderness she does have positive paracervical tenderness bilaterally  Cardiovascular: Normal rate and regular rhythm.   No murmur heard. Pulmonary/Chest: Effort normal and breath sounds normal. No stridor. No respiratory distress. She has no wheezes. She has no rales. She exhibits no tenderness.  Abdominal: Soft. Bowel  sounds are normal. She exhibits no mass. There is no tenderness. There is no rebound.  Musculoskeletal: She exhibits no edema and no tenderness.  Baseline ROM, moves extremities with no obvious new focal weakness. Baseline diffuse lumbar and paralumbar tenderness without bubble deformity or bruising noted.  Lymphadenopathy:    She has no cervical adenopathy.  Neurological: She is alert.  Awake, alert, cooperative and aware of situation; motor strength bilaterally; sensation normal to light touch bilaterally; peripheral visual fields full to confrontation; no facial asymmetry; tongue midline; major cranial nerves appear intact; no pronator drift, normal finger to nose bilaterally, baseline gait without new ataxia.  Skin: No rash noted.  Psychiatric:  Anxious without SI/HI/hallucinations    ED Course  Procedures (including critical care time) ECG: Sinus bradycardia, ventricular rate 59, normal axis, normal intervals, no acute ischemic changes noted, no comparison ECG available  Patient understand and agree with initial ED impression and plan with expectations set for ED visit. Pt stable in ED with no significant deterioration in condition. Patient informed of clinical course, understand medical decision-making process, and agree with plan. Labs Reviewed - No data to display No results found.   1. Syncope   2. Anxiety   3. Minor head injury, subsequent encounter   4. Chronic pain       MDM  I doubt any other EMC precluding discharge at this time including, but not necessarily limited to the following:seiazure, Vtach.        Hurman Horn, MD 08/15/12 [phone]

## 2012-08-08 NOTE — ED Notes (Addendum)
Pt presents with c/o seizures that began last night around 9 pm. Pt says she had two seizures last night. Pt had no incontinence during seizure. Pt says each seizure last approx 5-10 minutes. Pt does not have hx of seizures. Pt says she has been having panic attacks recently. Also c/o back pain d/t falling on gravel when she had the seizure.

## 2012-08-26 ENCOUNTER — Emergency Department (HOSPITAL_BASED_OUTPATIENT_CLINIC_OR_DEPARTMENT_OTHER)
Admission: EM | Admit: 2012-08-26 | Discharge: 2012-08-27 | Disposition: A | Discharge status: Home / Self Care | Payer: Self-pay | Attending: Emergency Medicine | Admitting: Emergency Medicine

## 2012-08-26 ENCOUNTER — Encounter (HOSPITAL_BASED_OUTPATIENT_CLINIC_OR_DEPARTMENT_OTHER): Payer: Self-pay | Admitting: *Deleted

## 2012-08-26 DIAGNOSIS — Y9301 Activity, walking, marching and hiking: Secondary | ICD-10-CM | POA: Insufficient documentation

## 2012-08-26 DIAGNOSIS — R569 Unspecified convulsions: Secondary | ICD-10-CM | POA: Insufficient documentation

## 2012-08-26 DIAGNOSIS — F17200 Nicotine dependence, unspecified, uncomplicated: Secondary | ICD-10-CM | POA: Insufficient documentation

## 2012-08-26 DIAGNOSIS — S0993XA Unspecified injury of face, initial encounter: Secondary | ICD-10-CM | POA: Insufficient documentation

## 2012-08-26 DIAGNOSIS — W19XXXA Unspecified fall, initial encounter: Secondary | ICD-10-CM

## 2012-08-26 DIAGNOSIS — Y9289 Other specified places as the place of occurrence of the external cause: Secondary | ICD-10-CM | POA: Insufficient documentation

## 2012-08-26 DIAGNOSIS — F4310 Post-traumatic stress disorder, unspecified: Secondary | ICD-10-CM | POA: Insufficient documentation

## 2012-08-26 DIAGNOSIS — Z79899 Other long term (current) drug therapy: Secondary | ICD-10-CM | POA: Insufficient documentation

## 2012-08-26 DIAGNOSIS — W1789XA Other fall from one level to another, initial encounter: Secondary | ICD-10-CM | POA: Insufficient documentation

## 2012-08-26 DIAGNOSIS — F411 Generalized anxiety disorder: Secondary | ICD-10-CM | POA: Insufficient documentation

## 2012-08-26 DIAGNOSIS — S199XXA Unspecified injury of neck, initial encounter: Secondary | ICD-10-CM | POA: Insufficient documentation

## 2012-08-26 DIAGNOSIS — G8929 Other chronic pain: Secondary | ICD-10-CM | POA: Insufficient documentation

## 2012-08-26 DIAGNOSIS — R109 Unspecified abdominal pain: Secondary | ICD-10-CM | POA: Insufficient documentation

## 2012-08-26 DIAGNOSIS — F319 Bipolar disorder, unspecified: Secondary | ICD-10-CM | POA: Insufficient documentation

## 2012-08-26 DIAGNOSIS — M549 Dorsalgia, unspecified: Secondary | ICD-10-CM | POA: Insufficient documentation

## 2012-08-26 HISTORY — DX: Bipolar disorder, unspecified: F31.9

## 2012-08-26 HISTORY — DX: Post-traumatic stress disorder, unspecified: F43.10

## 2012-08-26 NOTE — ED Notes (Addendum)
Got dizzy and fell 2 hours ago while walking in her house. Alert on arrival. Self controlled shaking observed at triage. States she is having a seizure. No seizure activity witnessed at triage. States she ran out of Vicodin 2 days ago. Crying at triage. States she has been under tremendous stress.

## 2012-08-27 ENCOUNTER — Emergency Department (HOSPITAL_BASED_OUTPATIENT_CLINIC_OR_DEPARTMENT_OTHER): Payer: Self-pay

## 2012-08-27 MED ORDER — IBUPROFEN 400 MG PO TABS: 400.0000 mg | ORAL_TABLET | Freq: Four times a day (QID) | ORAL | Status: DC | PRN

## 2012-08-27 MED ORDER — KETOROLAC TROMETHAMINE 60 MG/2ML IM SOLN
60.0000 mg | Freq: Once | INTRAMUSCULAR | Status: AC
  Administered 2012-08-27: 60 mg via INTRAMUSCULAR
  Filled 2012-08-27: qty 2

## 2012-08-27 MED ORDER — LEVETIRACETAM 500 MG PO TABS
1000.0000 mg | ORAL_TABLET | Freq: Once | ORAL | Status: AC
  Administered 2012-08-27: 1000 mg via ORAL
  Filled 2012-08-27: qty 2

## 2012-08-27 NOTE — ED Provider Notes (Signed)
History     CSN: [id number]  Arrival date & time 08/26/12  2303   First MD Initiated Contact with Patient 08/27/12 0054      Chief Complaint  Patient presents with  . Fall    (Consider location/radiation/quality/duration/timing/severity/associated sxs/prior treatment) Patient is a 37 y.o. female presenting with fall. The history is provided by the patient. No language interpreter was used.  Fall The accident occurred less than 1 hour ago. The fall occurred while walking. She fell from a height of 1 to 2 ft. She landed on a hard floor. There was no blood loss. Pain location: neck. The pain is moderate. She was not ambulatory at the scene. There was no entrapment after the fall. Pertinent negatives include no visual change, no fever, no numbness, no abdominal pain, no nausea, no vomiting and no headaches. The symptoms are aggravated by activity. She has tried nothing for the symptoms. The treatment provided no relief.  States she hyperventilates and falls and has seizure like activity.  Was seen for same on 4/7.  Has not made an appointment with neurology  Past Medical History  Diagnosis Date  . Panic attacks   . Anxiety   . Chronic back pain   . Bipolar 1 disorder   . PTSD (post-traumatic stress disorder)     Past Surgical History  Procedure Laterality Date  . Cesarean section    . Abdominal hysterectomy    . Cholecystectomy      No family history on file.  History  Substance Use Topics  . Smoking status: Current Some Day Smoker    Types: Cigarettes  . Smokeless tobacco: Not on file  . Alcohol Use: No    OB History   Grav Para Term Preterm Abortions TAB SAB Ect Mult Living                  Review of Systems  Constitutional: Negative for fever.  HENT: Negative for neck stiffness.   Respiratory: Negative for shortness of breath.   Cardiovascular: Negative for chest pain.  Gastrointestinal: Negative for nausea, vomiting and abdominal pain.  Neurological: Positive  for seizures. Negative for numbness and headaches.  All other systems reviewed and are negative.    Allergies  Review of patient's allergies indicates no known allergies.  Home Medications   Current Outpatient Rx  Name  Route  Sig  Dispense  Refill  . clonazePAM (KLONOPIN) 1 MG tablet   Oral   Take 1 mg by mouth 2 (two) times daily as needed for anxiety.         . ciprofloxacin (CIPRO) 500 MG tablet   Oral   Take 1 tablet (500 mg total) by mouth 2 (two) times daily.   14 tablet   0   . FLUoxetine (PROZAC) 20 MG tablet   Oral   Take 20 mg by mouth daily.         Marland Kitchen HYDROcodone-acetaminophen (NORCO/VICODIN) 5-325 MG per tablet   Oral   Take 1 tablet by mouth every 6 (six) hours as needed for pain.   12 tablet   0   . hydrocortisone-pramoxine (PROCTOFOAM HC) rectal foam   Rectal   Place 1 applicator rectally 2 (two) times daily.   10 g   0     BP 141/85  Pulse 80  Temp(Src) 97.9 F (36.6 C) (Oral)  Resp 18  Wt 124 lb (56.246 kg)  BMI 20.02 kg/m2  SpO2 96%  Physical Exam  Constitutional: She is  oriented to person, place, and time. She appears well-developed and well-nourished. No distress.  HENT:  Head: Normocephalic and atraumatic. Head is without raccoon's eyes and without Battle's sign.  Right Ear: No hemotympanum.  Left Ear: No hemotympanum.  Mouth/Throat: Oropharynx is clear and moist.  Eyes: Conjunctivae are normal. Pupils are equal, round, and reactive to light.  Neck: Normal range of motion. Neck supple.  No midline tenderness  Cardiovascular: Normal rate, regular rhythm and intact distal pulses.   Pulmonary/Chest: Effort normal and breath sounds normal. No stridor. She has no wheezes. She has no rales.  Abdominal: Soft. Bowel sounds are normal. There is no tenderness. There is no rebound and no guarding.  Musculoskeletal: Normal range of motion. She exhibits no tenderness.  Lymphadenopathy:    She has no cervical adenopathy.  Neurological: She  is alert and oriented to person, place, and time. She has normal reflexes. No cranial nerve deficit.  Skin: Skin is warm and dry.    ED Course  Procedures (including critical care time)  Labs Reviewed - No data to display Dg Cervical Spine Complete  08/27/2012  *RADIOLOGY REPORT*  Clinical Data: Pain in the posterior neck and upper back after fall.  CERVICAL SPINE - COMPLETE 4+ VIEW  Comparison: 08/05/2012  Findings: Straightening of the usual cervical lordosis which is probably due to patient positioning but ligamentous injury muscle spasm can also have this appearance.  No abnormal anterior subluxation.  Normal alignment of the facet joints.  Lateral masses of C1 appear symmetrical.  The odontoid process appears intact.  No vertebral compression deformities.  Narrowed interspaces at C5-6 and C6-7 levels due to degenerative change with hypertrophic changes at the endplates.  No prevertebral soft tissue swelling. No focal bone lesion or bone destruction.  No significant change since previous study.  IMPRESSION: Straightening of the usual cervical lordosis.  Mild degenerative changes.  No displaced fractures identified.   Original Report Authenticated By: Burman Nieves, M.D.      No diagnosis found.    MDM  Suspect poly substance abuse as source.  No driving until cleared by neurology.  Will need to be seen by neurology.  No tylenol PM no narcotics. Stable for d/c at this time        Gicela Schwarting Smitty Cords, MD 08/27/12 [phone]

## 2012-08-27 NOTE — ED Notes (Signed)
Warm blanket given

## 2012-08-27 NOTE — ED Notes (Signed)
Pt and visitor given Coke. Pt sitting up eating chips and drinking soda. NAD.

## 2012-08-27 NOTE — ED Notes (Signed)
Patient transported to X-ray 

## 2012-09-04 ENCOUNTER — Emergency Department (HOSPITAL_COMMUNITY)
Admission: EM | Admit: 2012-09-04 | Discharge: 2012-09-05 | Disposition: A | Discharge status: Home / Self Care | Payer: Self-pay | Attending: Emergency Medicine | Admitting: Emergency Medicine

## 2012-09-04 ENCOUNTER — Encounter (HOSPITAL_COMMUNITY): Payer: Self-pay | Admitting: *Deleted

## 2012-09-04 DIAGNOSIS — F411 Generalized anxiety disorder: Secondary | ICD-10-CM | POA: Insufficient documentation

## 2012-09-04 DIAGNOSIS — G8929 Other chronic pain: Secondary | ICD-10-CM | POA: Insufficient documentation

## 2012-09-04 DIAGNOSIS — N390 Urinary tract infection, site not specified: Secondary | ICD-10-CM | POA: Insufficient documentation

## 2012-09-04 DIAGNOSIS — R109 Unspecified abdominal pain: Secondary | ICD-10-CM | POA: Insufficient documentation

## 2012-09-04 DIAGNOSIS — R350 Frequency of micturition: Secondary | ICD-10-CM | POA: Insufficient documentation

## 2012-09-04 DIAGNOSIS — F319 Bipolar disorder, unspecified: Secondary | ICD-10-CM | POA: Insufficient documentation

## 2012-09-04 DIAGNOSIS — N760 Acute vaginitis: Secondary | ICD-10-CM | POA: Insufficient documentation

## 2012-09-04 DIAGNOSIS — M549 Dorsalgia, unspecified: Secondary | ICD-10-CM | POA: Insufficient documentation

## 2012-09-04 DIAGNOSIS — Z90710 Acquired absence of both cervix and uterus: Secondary | ICD-10-CM | POA: Insufficient documentation

## 2012-09-04 DIAGNOSIS — R112 Nausea with vomiting, unspecified: Secondary | ICD-10-CM | POA: Insufficient documentation

## 2012-09-04 DIAGNOSIS — F4310 Post-traumatic stress disorder, unspecified: Secondary | ICD-10-CM | POA: Insufficient documentation

## 2012-09-04 DIAGNOSIS — Z79899 Other long term (current) drug therapy: Secondary | ICD-10-CM | POA: Insufficient documentation

## 2012-09-04 DIAGNOSIS — F17200 Nicotine dependence, unspecified, uncomplicated: Secondary | ICD-10-CM | POA: Insufficient documentation

## 2012-09-04 DIAGNOSIS — N10 Acute tubulo-interstitial nephritis: Secondary | ICD-10-CM | POA: Insufficient documentation

## 2012-09-04 DIAGNOSIS — Z9089 Acquired absence of other organs: Secondary | ICD-10-CM | POA: Insufficient documentation

## 2012-09-04 DIAGNOSIS — Z3202 Encounter for pregnancy test, result negative: Secondary | ICD-10-CM | POA: Insufficient documentation

## 2012-09-04 DIAGNOSIS — B9689 Other specified bacterial agents as the cause of diseases classified elsewhere: Secondary | ICD-10-CM

## 2012-09-04 LAB — URINALYSIS, ROUTINE W REFLEX MICROSCOPIC
Glucose, UA: NEGATIVE mg/dL
Hgb urine dipstick: NEGATIVE
Ketones, ur: NEGATIVE mg/dL
Protein, ur: NEGATIVE mg/dL

## 2012-09-04 LAB — POCT I-STAT, CHEM 8
Calcium, Ion: 1.13 mmol/L (ref 1.12–1.23)
Glucose, Bld: 104 mg/dL — ABNORMAL HIGH (ref 70–99)
HCT: 39 % (ref 36.0–46.0)
Hemoglobin: 13.3 g/dL (ref 12.0–15.0)

## 2012-09-04 MED ORDER — ONDANSETRON HCL 4 MG/2ML IJ SOLN
4.0000 mg | Freq: Once | INTRAMUSCULAR | Status: AC
Start: 2012-09-04 — End: 2012-09-04
  Administered 2012-09-04: 4 mg via INTRAVENOUS
  Filled 2012-09-04: qty 2

## 2012-09-04 MED ORDER — HYDROMORPHONE HCL PF 1 MG/ML IJ SOLN
1.0000 mg | Freq: Once | INTRAMUSCULAR | Status: AC
  Administered 2012-09-04: 1 mg via INTRAVENOUS
  Filled 2012-09-04: qty 1

## 2012-09-04 MED ORDER — HYDROMORPHONE HCL PF 1 MG/ML IJ SOLN
0.5000 mg | Freq: Once | INTRAMUSCULAR | Status: AC
  Administered 2012-09-04: 0.5 mg via INTRAVENOUS
  Filled 2012-09-04: qty 1

## 2012-09-04 MED ORDER — HYDROMORPHONE HCL PF 1 MG/ML IJ SOLN
1.0000 mg | Freq: Once | INTRAMUSCULAR | Status: AC
  Administered 2012-09-05: 1 mg via INTRAVENOUS
  Filled 2012-09-04: qty 1

## 2012-09-04 MED ORDER — KETOROLAC TROMETHAMINE 30 MG/ML IJ SOLN
30.0000 mg | Freq: Once | INTRAMUSCULAR | Status: AC
Start: 2012-09-04 — End: 2012-09-04
  Administered 2012-09-04: 30 mg via INTRAVENOUS
  Filled 2012-09-04: qty 1

## 2012-09-04 MED ORDER — SODIUM CHLORIDE 0.9 % IV SOLN
Freq: Once | INTRAVENOUS | Status: AC
  Administered 2012-09-04: 22:00:00 via INTRAVENOUS

## 2012-09-04 MED ORDER — DEXTROSE 5 % IV SOLN
1.0000 g | Freq: Once | INTRAVENOUS | Status: AC
  Administered 2012-09-04: 1 g via INTRAVENOUS
  Filled 2012-09-04: qty 10

## 2012-09-04 NOTE — ED Notes (Signed)
Pt states this morning started having severe burning w/ urination and freq, has been taking azo but not helping with pain, pt states last night was having kidney pain.

## 2012-09-04 NOTE — ED Provider Notes (Signed)
History     CSN: [id number]  Arrival date & time 09/04/12  1730   First MD Initiated Contact with Patient 09/04/12 2124      Chief Complaint  Patient presents with  . Urinary Tract Infection    (Consider location/radiation/quality/duration/timing/severity/associated sxs/prior treatment) HPI Beth Best is a 37 y.o. female with a history of chronic back pain and bipolar disorder presents emergency department complaining of dysuria and urinary frequency.  Onset of symptoms began this morning.  Pain is not relieved with azo.  No known exacerbating factors.  Associated symptoms include bilateral flank pain and nausea.  Emesis x1 while in the emergency department.  Patient denies hematuria, fever, night sweats or chills.  In addition patient reports mild malodor from vaginal discharge that is consistent with bacterial vaginosis of which she has had before in the past.  Patient denies any suprapubic pain, thick purulent discharge, vaginal pruritus or dyspareunia.  Past Medical History  Diagnosis Date  . Panic attacks   . Anxiety   . Chronic back pain   . Bipolar 1 disorder   . PTSD (post-traumatic stress disorder)     Past Surgical History  Procedure Laterality Date  . Cesarean section    . Abdominal hysterectomy    . Cholecystectomy      No family history on file.  History  Substance Use Topics  . Smoking status: Current Some Day Smoker    Types: Cigarettes  . Smokeless tobacco: Not on file  . Alcohol Use: No    OB History   Grav Para Term Preterm Abortions TAB SAB Ect Mult Living                  Review of Systems Ten systems reviewed and are negative for acute change, except as noted in the HPI.    Allergies  Review of patient's allergies indicates no known allergies.  Home Medications   Current Outpatient Rx  Name  Route  Sig  Dispense  Refill  . FLUoxetine (PROZAC) 20 MG tablet   Oral   Take 20 mg by mouth daily.         .  hydrocortisone-pramoxine (PROCTOFOAM HC) rectal foam   Rectal   Place 1 applicator rectally 2 (two) times daily.   10 g   0   . ibuprofen (ADVIL,MOTRIN) 400 MG tablet   Oral   Take 1 tablet (400 mg total) by mouth every 6 (six) hours as needed for pain.   30 tablet   0   . ciprofloxacin (CIPRO) 500 MG tablet   Oral   Take 1 tablet (500 mg total) by mouth 2 (two) times daily.   14 tablet   0   . clonazePAM (KLONOPIN) 1 MG tablet   Oral   Take 1 mg by mouth 2 (two) times daily as needed for anxiety.         Marland Kitchen HYDROcodone-acetaminophen (NORCO/VICODIN) 5-325 MG per tablet   Oral   Take 1 tablet by mouth every 6 (six) hours as needed for pain.   12 tablet   0     BP 128/91  Pulse 87  Temp(Src) 99 F (37.2 C) (Oral)  Resp 17  SpO2 95%  Physical Exam  Nursing note and vitals reviewed. Constitutional: She is oriented to person, place, and time. She appears well-developed and well-nourished. No distress.  HENT:  Head: Normocephalic and atraumatic.  Eyes: Conjunctivae and EOM are normal.  Neck: Normal range of motion.  Cardiovascular: Normal  rate.   Pulmonary/Chest: Effort normal and breath sounds normal.  Abdominal: Bowel sounds are normal.  Abdomen soft with mild suprapubic tenderness to palpation.  No peritoneal signs.  Genitourinary:  Pelvic exam deferred.  Bilateral CVA tenderness.  Musculoskeletal: Normal range of motion.  Neurological: She is alert and oriented to person, place, and time.  Skin: Skin is warm and dry. No rash noted. She is not diaphoretic.  Psychiatric: She has a normal mood and affect. Her behavior is normal.    ED Course  Procedures (including critical care time)  Labs Reviewed  URINALYSIS, ROUTINE W REFLEX MICROSCOPIC - Abnormal; Notable for the following:    Color, Urine ORANGE (*)    APPearance CLOUDY (*)    Bilirubin Urine SMALL (*)    Urobilinogen, UA 2.0 (*)    Nitrite POSITIVE (*)    Leukocytes, UA LARGE (*)    All other  components within normal limits  URINE CULTURE  PREGNANCY, URINE  URINE MICROSCOPIC-ADD ON   No results found.   No diagnosis found.  MDM  Pyelonephritis BV Patient emergency department complaining of dysuria and urinary frequency onset this morning.  Clinical diagnosis of pyelonephritis with too numerous to count white blood cells positive nitrites and positive leukocytes on exam.  In addition patient reported mild malodor with normal looking vaginal discharge that is consistent with bacterial vaginosis.  Had in-depth discussion that a pelvic exam is recommended to assure no sexually transmitted disease is the etiology of her symptoms.  Pelvic exam has been deferred.  Recommended followup with OB/GYN.  Treatment in the emergency department as above.  Will discharge with Flagyl prescription as well as Keflex.  Addendum:  1:27 AM Pelvic exam performed. Pt w complete hysterectomy. malodorous vaginal dc present. Will dc w flagyl. Cultures pending.   Exam performed by Jaci Carrel,  exam chaperoned Date: 09/05/2012 Pelvic exam: normal external genitalia without evidence of trauma. VULVA: normal appearing vulva with no masses, tenderness or lesion. VAGINA: normal appearing vagina with normal color and discharge, no lesions. vaginal discharge - white, malodorous and thin, Wet prep and DNA probe for chlamydia and GC obtained.           Jaci Carrel, [place] 09/05/12 0129

## 2012-09-04 NOTE — ED Notes (Signed)
Patient ambulated to restroom by fiance.

## 2012-09-04 NOTE — ED Notes (Signed)
Patient reports a recent UTI, states she has had bilateral flank pain x2 days and increased frequency in urination. Will con't to monitor.

## 2012-09-05 LAB — WET PREP, GENITAL: WBC, Wet Prep HPF POC: NONE SEEN

## 2012-09-05 LAB — URINE CULTURE: Colony Count: 5000

## 2012-09-05 MED ORDER — ONDANSETRON 4 MG PO TBDP: 4.0000 mg | ORAL_TABLET | Freq: Four times a day (QID) | ORAL | Status: DC | PRN

## 2012-09-05 MED ORDER — PERCOCET 5-325 MG PO TABS: 1.0000 | ORAL_TABLET | Freq: Four times a day (QID) | ORAL | Status: DC | PRN

## 2012-09-05 MED ORDER — CEPHALEXIN 500 MG PO CAPS: ORAL_CAPSULE | ORAL | Status: DC

## 2012-09-05 MED ORDER — METRONIDAZOLE 500 MG PO TABS: 500.0000 mg | ORAL_TABLET | Freq: Two times a day (BID) | ORAL | Status: DC

## 2012-09-05 NOTE — ED Provider Notes (Signed)
Medical screening examination/treatment/procedure(s) were performed by non-physician practitioner and as supervising physician I was immediately available for consultation/collaboration.   Lyanne Co, MD 09/05/12 [phone]

## 2012-10-17 ENCOUNTER — Emergency Department (HOSPITAL_BASED_OUTPATIENT_CLINIC_OR_DEPARTMENT_OTHER): Payer: Self-pay

## 2012-10-17 ENCOUNTER — Emergency Department (HOSPITAL_BASED_OUTPATIENT_CLINIC_OR_DEPARTMENT_OTHER)
Admission: EM | Admit: 2012-10-17 | Discharge: 2012-10-17 | Disposition: A | Discharge status: Home / Self Care | Payer: Self-pay | Attending: Emergency Medicine | Admitting: Emergency Medicine

## 2012-10-17 ENCOUNTER — Encounter (HOSPITAL_BASED_OUTPATIENT_CLINIC_OR_DEPARTMENT_OTHER): Payer: Self-pay | Admitting: *Deleted

## 2012-10-17 DIAGNOSIS — S92309A Fracture of unspecified metatarsal bone(s), unspecified foot, initial encounter for closed fracture: Secondary | ICD-10-CM | POA: Insufficient documentation

## 2012-10-17 DIAGNOSIS — F319 Bipolar disorder, unspecified: Secondary | ICD-10-CM | POA: Insufficient documentation

## 2012-10-17 DIAGNOSIS — X500XXA Overexertion from strenuous movement or load, initial encounter: Secondary | ICD-10-CM | POA: Insufficient documentation

## 2012-10-17 DIAGNOSIS — G8929 Other chronic pain: Secondary | ICD-10-CM | POA: Insufficient documentation

## 2012-10-17 DIAGNOSIS — Z8659 Personal history of other mental and behavioral disorders: Secondary | ICD-10-CM | POA: Insufficient documentation

## 2012-10-17 DIAGNOSIS — F411 Generalized anxiety disorder: Secondary | ICD-10-CM | POA: Insufficient documentation

## 2012-10-17 DIAGNOSIS — F17200 Nicotine dependence, unspecified, uncomplicated: Secondary | ICD-10-CM | POA: Insufficient documentation

## 2012-10-17 DIAGNOSIS — Y9367 Activity, basketball: Secondary | ICD-10-CM | POA: Insufficient documentation

## 2012-10-17 DIAGNOSIS — N390 Urinary tract infection, site not specified: Secondary | ICD-10-CM | POA: Insufficient documentation

## 2012-10-17 DIAGNOSIS — Y9239 Other specified sports and athletic area as the place of occurrence of the external cause: Secondary | ICD-10-CM | POA: Insufficient documentation

## 2012-10-17 DIAGNOSIS — Z792 Long term (current) use of antibiotics: Secondary | ICD-10-CM | POA: Insufficient documentation

## 2012-10-17 DIAGNOSIS — R300 Dysuria: Secondary | ICD-10-CM | POA: Insufficient documentation

## 2012-10-17 DIAGNOSIS — R296 Repeated falls: Secondary | ICD-10-CM | POA: Insufficient documentation

## 2012-10-17 DIAGNOSIS — S92301A Fracture of unspecified metatarsal bone(s), right foot, initial encounter for closed fracture: Secondary | ICD-10-CM

## 2012-10-17 LAB — URINALYSIS, ROUTINE W REFLEX MICROSCOPIC
Glucose, UA: NEGATIVE mg/dL
Protein, ur: NEGATIVE mg/dL

## 2012-10-17 LAB — URINE MICROSCOPIC-ADD ON

## 2012-10-17 MED ORDER — PHENAZOPYRIDINE HCL 200 MG PO TABS: 200.0000 mg | ORAL_TABLET | Freq: Three times a day (TID) | ORAL | Status: DC

## 2012-10-17 MED ORDER — OXYCODONE-ACETAMINOPHEN 5-325 MG PO TABS
1.0000 | ORAL_TABLET | Freq: Once | ORAL | Status: AC
  Administered 2012-10-17: 1 via ORAL
  Filled 2012-10-17 (×2): qty 1

## 2012-10-17 MED ORDER — OXYCODONE-ACETAMINOPHEN 5-325 MG PO TABS: 1.0000 | ORAL_TABLET | ORAL | Status: DC | PRN

## 2012-10-17 MED ORDER — CIPROFLOXACIN HCL 500 MG PO TABS: 500.0000 mg | ORAL_TABLET | Freq: Two times a day (BID) | ORAL | Status: DC

## 2012-10-17 NOTE — ED Provider Notes (Signed)
History     CSN: [id number]  Arrival date & time 10/17/12  2213   First MD Initiated Contact with Patient 10/17/12 2221      Chief Complaint  Patient presents with  . Foot Injury    (Consider location/radiation/quality/duration/timing/severity/associated sxs/prior treatment) The history is provided by the patient.   Delusive ED for right foot pain. States she was playing basketball earlier when she twisted her ankle and fell awkwardly on her right foot. Denies any head trauma or loss of consciousness. Patient states swelling and pain began immediately after incident. Patient denies any previous right foot or ankle injury.  No numbness or paresthesias of right foot or toes. Patient has not taken any medications for her symptoms.  Patient also notes recent dysuria and pressure over her bladder. Notes she frequently gets UTIs.  Denies hematuria or increased urinary frequency.  No fevers, sweats, chills, flank pain, vaginal discharge, nausea, vomiting, or diarrhea.  Past Medical History  Diagnosis Date  . Panic attacks   . Anxiety   . Chronic back pain   . Bipolar 1 disorder   . PTSD (post-traumatic stress disorder)     Past Surgical History  Procedure Laterality Date  . Cesarean section    . Abdominal hysterectomy    . Cholecystectomy      History reviewed. No pertinent family history.  History  Substance Use Topics  . Smoking status: Current Some Day Smoker -- 0.50 packs/day    Types: Cigarettes  . Smokeless tobacco: Not on file  . Alcohol Use: No    OB History   Grav Para Term Preterm Abortions TAB SAB Ect Mult Living                  Review of Systems  Genitourinary: Positive for dysuria.  Musculoskeletal: Positive for arthralgias.  All other systems reviewed and are negative.    Allergies  Review of patient's allergies indicates no known allergies.  Home Medications   Current Outpatient Rx  Name  Route  Sig  Dispense  Refill  . cephALEXin (KEFLEX)  500 MG capsule      2 caps po bid x 7 days   28 capsule   0   . ciprofloxacin (CIPRO) 500 MG tablet   Oral   Take 1 tablet (500 mg total) by mouth 2 (two) times daily.   14 tablet   0   . clonazePAM (KLONOPIN) 1 MG tablet   Oral   Take 1 mg by mouth 2 (two) times daily as needed for anxiety.         Marland Kitchen FLUoxetine (PROZAC) 20 MG tablet   Oral   Take 20 mg by mouth daily.         Marland Kitchen HYDROcodone-acetaminophen (NORCO/VICODIN) 5-325 MG per tablet   Oral   Take 1 tablet by mouth every 6 (six) hours as needed for pain.   12 tablet   0   . hydrocortisone-pramoxine (PROCTOFOAM HC) rectal foam   Rectal   Place 1 applicator rectally 2 (two) times daily.   10 g   0   . ibuprofen (ADVIL,MOTRIN) 400 MG tablet   Oral   Take 1 tablet (400 mg total) by mouth every 6 (six) hours as needed for pain.   30 tablet   0   . metroNIDAZOLE (FLAGYL) 500 MG tablet   Oral   Take 1 tablet (500 mg total) by mouth 2 (two) times daily. One po bid x 7 days   14  tablet   1   . ondansetron (ZOFRAN ODT) 4 MG disintegrating tablet   Oral   Take 1 tablet (4 mg total) by mouth every 6 (six) hours as needed for nausea.   6 tablet   0   . PERCOCET 5-325 MG per tablet   Oral   Take 1 tablet by mouth every 6 (six) hours as needed for pain.   15 tablet   0     Dispense as written.     BP 116/71  Pulse 92  Temp(Src) 98.2 F (36.8 C) (Oral)  Resp 16  Ht 5\' 6"  (1.676 m)  Wt 125 lb (56.7 kg)  BMI 20.19 kg/m2  SpO2 100%  Physical Exam  Nursing note and vitals reviewed. Constitutional: She is oriented to person, place, and time. She appears well-developed and well-nourished.  HENT:  Head: Normocephalic and atraumatic.  Eyes: Conjunctivae and EOM are normal.  Neck: Normal range of motion. Neck supple.  Cardiovascular: Normal rate, regular rhythm and normal heart sounds.   Pulmonary/Chest: Effort normal and breath sounds normal.  Abdominal: Bowel sounds are normal. There is no tenderness.  There is no CVA tenderness, no tenderness at McBurney's point and negative Murphy's sign.  Musculoskeletal:       Right ankle: She exhibits decreased range of motion, swelling and ecchymosis. She exhibits no deformity and normal pulse. Tenderness. Achilles tendon normal.       Feet:  TTP of 5th metatarsal with associated swelling and bruising, moves all toes appropriately, strong distal pulse, sensation intact  Neurological: She is alert and oriented to person, place, and time.  Skin: Skin is warm and dry.  Psychiatric: She has a normal mood and affect.    ED Course  Procedures (including critical care time)  Labs Reviewed  URINALYSIS, ROUTINE W REFLEX MICROSCOPIC - Abnormal; Notable for the following:    Color, Urine AMBER (*)    APPearance CLOUDY (*)    Nitrite POSITIVE (*)    Leukocytes, UA MODERATE (*)    All other components within normal limits  URINE MICROSCOPIC-ADD ON   Dg Foot Complete Right  10/17/2012   *RADIOLOGY REPORT*  Clinical Data: Foot injury complaining of pain over the fifth metatarsal.  RIGHT FOOT COMPLETE - 3+ VIEW  Comparison: No priors.  Findings: Three views of the right foot demonstrate an acute nondisplaced oblique fracture through the distal third of the fifth metatarsal with overlying soft tissue swelling.  Postoperative changes likely from prior bunion repair in the distal first metatarsal.  No other acute displaced fracture, subluxation or dislocation is noted.  IMPRESSION: 1.  Acute nondisplaced oblique fracture through the distal aspect of the fifth metatarsal.   Original Report Authenticated By: Trudie Reed, M.D.     1. Metatarsal fracture, right, closed, initial encounter   2. UTI (lower urinary tract infection)   3. Dysuria       MDM   U/a nitrite +, culture pending.  X-ray as above- fx of right 5th metatarsal.  FU with orthopedics, Dr. Victorino Dike.  Post-op shoe and crutches given.  Rx cipro, pyridium, percocet.  Discussed plan with pt, she  agreed.  Return precautions advised.        Garlon Hatchet, PA-C 10/17/12 2328

## 2012-10-17 NOTE — ED Provider Notes (Signed)
Medical screening examination/treatment/procedure(s) were performed by non-physician practitioner and as supervising physician I was immediately available for consultation/collaboration.   Wentworth Edelen, MD 10/17/12 2344 

## 2012-10-17 NOTE — ED Notes (Signed)
Pt c/o right foot injury while playing basketball

## 2012-10-17 NOTE — ED Notes (Signed)
Pt also c/o urinary freq and dysuria and requests to be checked for UTI

## 2012-10-20 LAB — URINE CULTURE

## 2012-10-21 NOTE — ED Notes (Signed)
Post ED Visit - Positive Culture Follow-up  Culture report reviewed by antimicrobial stewardship pharmacist: []  Wes Dulaney, Pharm.D., BCPS []  Celedonio Miyamoto, Pharm.D., BCPS [x]  Georgina Pillion, Pharm.D., BCPS []  [place], [place].D., BCPS, AAHIVP []  Estella Husk, Pharm.D., BCPS, AAHIVP  Positive Urine culture Treated with Ciprofoxacin, organism sensitive to the same and no further patient follow-up is required at this time.  Larena Sox 10/21/2012, 2:38 PM

## 2012-10-25 ENCOUNTER — Emergency Department (HOSPITAL_BASED_OUTPATIENT_CLINIC_OR_DEPARTMENT_OTHER)
Admission: EM | Admit: 2012-10-25 | Discharge: 2012-10-26 | Disposition: A | Discharge status: Home / Self Care | Payer: Self-pay | Attending: Emergency Medicine | Admitting: Emergency Medicine

## 2012-10-25 ENCOUNTER — Encounter (HOSPITAL_BASED_OUTPATIENT_CLINIC_OR_DEPARTMENT_OTHER): Payer: Self-pay | Admitting: *Deleted

## 2012-10-25 ENCOUNTER — Emergency Department (HOSPITAL_BASED_OUTPATIENT_CLINIC_OR_DEPARTMENT_OTHER): Payer: Self-pay

## 2012-10-25 DIAGNOSIS — Y939 Activity, unspecified: Secondary | ICD-10-CM | POA: Insufficient documentation

## 2012-10-25 DIAGNOSIS — Z8659 Personal history of other mental and behavioral disorders: Secondary | ICD-10-CM | POA: Insufficient documentation

## 2012-10-25 DIAGNOSIS — S92901G Unspecified fracture of right foot, subsequent encounter for fracture with delayed healing: Secondary | ICD-10-CM

## 2012-10-25 DIAGNOSIS — Y929 Unspecified place or not applicable: Secondary | ICD-10-CM | POA: Insufficient documentation

## 2012-10-25 DIAGNOSIS — X500XXA Overexertion from strenuous movement or load, initial encounter: Secondary | ICD-10-CM | POA: Insufficient documentation

## 2012-10-25 DIAGNOSIS — F17200 Nicotine dependence, unspecified, uncomplicated: Secondary | ICD-10-CM | POA: Insufficient documentation

## 2012-10-25 DIAGNOSIS — F319 Bipolar disorder, unspecified: Secondary | ICD-10-CM | POA: Insufficient documentation

## 2012-10-25 DIAGNOSIS — S99919A Unspecified injury of unspecified ankle, initial encounter: Secondary | ICD-10-CM | POA: Insufficient documentation

## 2012-10-25 DIAGNOSIS — Z79899 Other long term (current) drug therapy: Secondary | ICD-10-CM | POA: Insufficient documentation

## 2012-10-25 DIAGNOSIS — S8990XA Unspecified injury of unspecified lower leg, initial encounter: Secondary | ICD-10-CM | POA: Insufficient documentation

## 2012-10-25 DIAGNOSIS — G8929 Other chronic pain: Secondary | ICD-10-CM | POA: Insufficient documentation

## 2012-10-25 DIAGNOSIS — X503XXA Overexertion from repetitive movements, initial encounter: Secondary | ICD-10-CM | POA: Insufficient documentation

## 2012-10-25 DIAGNOSIS — M549 Dorsalgia, unspecified: Secondary | ICD-10-CM | POA: Insufficient documentation

## 2012-10-25 DIAGNOSIS — F410 Panic disorder [episodic paroxysmal anxiety] without agoraphobia: Secondary | ICD-10-CM | POA: Insufficient documentation

## 2012-10-25 DIAGNOSIS — F411 Generalized anxiety disorder: Secondary | ICD-10-CM | POA: Insufficient documentation

## 2012-10-25 MED ORDER — OXYCODONE-ACETAMINOPHEN 5-325 MG PO TABS: 1.0000 | ORAL_TABLET | Freq: Four times a day (QID) | ORAL | Status: DC | PRN

## 2012-10-25 NOTE — ED Provider Notes (Signed)
History    CSN: [id number] Arrival date & time 10/25/12  2217  First MD Initiated Contact with Patient 10/25/12 2303     Chief Complaint  Patient presents with  . Foot Pain   (Consider location/radiation/quality/duration/timing/severity/associated sxs/prior Treatment) Patient is a 37 y.o. female presenting with lower extremity pain. The history is provided by the patient. No language interpreter was used.  Foot Pain This is a recurrent problem. The current episode started 1 to 2 hours ago. The problem occurs constantly. Pertinent negatives include no chest pain, no abdominal pain, no headaches and no shortness of breath. Nothing aggravates the symptoms. Nothing relieves the symptoms. She has tried nothing for the symptoms. The treatment provided no relief.  Fractured foot andf was seen on the 16th and did not follow up with ortho now rolled the foot and is having increased pain Past Medical History  Diagnosis Date  . Panic attacks   . Anxiety   . Chronic back pain   . Bipolar 1 disorder   . PTSD (post-traumatic stress disorder)    Past Surgical History  Procedure Laterality Date  . Cesarean section    . Abdominal hysterectomy    . Cholecystectomy     No family history on file. History  Substance Use Topics  . Smoking status: Current Some Day Smoker -- 0.50 packs/day    Types: Cigarettes  . Smokeless tobacco: Not on file  . Alcohol Use: No   OB History   Grav Para Term Preterm Abortions TAB SAB Ect Mult Living                 Review of Systems  Respiratory: Negative for shortness of breath.   Cardiovascular: Negative for chest pain.  Gastrointestinal: Negative for abdominal pain.  Neurological: Negative for headaches.  All other systems reviewed and are negative.    Allergies  Review of patient's allergies indicates no known allergies.  Home Medications   Current Outpatient Rx  Name  Route  Sig  Dispense  Refill  . cephALEXin (KEFLEX) 500 MG capsule     2 caps po bid x 7 days   28 capsule   0   . ciprofloxacin (CIPRO) 500 MG tablet   Oral   Take 1 tablet (500 mg total) by mouth 2 (two) times daily.   14 tablet   0   . ciprofloxacin (CIPRO) 500 MG tablet   Oral   Take 1 tablet (500 mg total) by mouth 2 (two) times daily.   14 tablet   0   . clonazePAM (KLONOPIN) 1 MG tablet   Oral   Take 1 mg by mouth 2 (two) times daily as needed for anxiety.         Marland Kitchen FLUoxetine (PROZAC) 20 MG tablet   Oral   Take 20 mg by mouth daily.         Marland Kitchen HYDROcodone-acetaminophen (NORCO/VICODIN) 5-325 MG per tablet   Oral   Take 1 tablet by mouth every 6 (six) hours as needed for pain.   12 tablet   0   . hydrocortisone-pramoxine (PROCTOFOAM HC) rectal foam   Rectal   Place 1 applicator rectally 2 (two) times daily.   10 g   0   . ibuprofen (ADVIL,MOTRIN) 400 MG tablet   Oral   Take 1 tablet (400 mg total) by mouth every 6 (six) hours as needed for pain.   30 tablet   0   . metroNIDAZOLE (FLAGYL) 500 MG tablet  Oral   Take 1 tablet (500 mg total) by mouth 2 (two) times daily. One po bid x 7 days   14 tablet   1   . ondansetron (ZOFRAN ODT) 4 MG disintegrating tablet   Oral   Take 1 tablet (4 mg total) by mouth every 6 (six) hours as needed for nausea.   6 tablet   0   . oxyCODONE-acetaminophen (PERCOCET) 5-325 MG per tablet   Oral   Take 1 tablet by mouth every 6 (six) hours as needed for pain.   10 tablet   0   . oxyCODONE-acetaminophen (PERCOCET/ROXICET) 5-325 MG per tablet   Oral   Take 1 tablet by mouth every 4 (four) hours as needed for pain.   20 tablet   0   . PERCOCET 5-325 MG per tablet   Oral   Take 1 tablet by mouth every 6 (six) hours as needed for pain.   15 tablet   0     Dispense as written.   . phenazopyridine (PYRIDIUM) 200 MG tablet   Oral   Take 1 tablet (200 mg total) by mouth 3 (three) times daily.   9 tablet   0    BP 118/73  Pulse 80  Temp(Src) 99 F (37.2 C) (Oral)  Resp 20   Ht 5\' 6"  (1.676 m)  Wt 125 lb (56.7 kg)  BMI 20.19 kg/m2  SpO2 98% Physical Exam  Constitutional: She is oriented to person, place, and time. She appears well-developed and well-nourished. No distress.  HENT:  Head: Normocephalic and atraumatic.  Mouth/Throat: Oropharynx is clear and moist.  Eyes: Conjunctivae are normal. Pupils are equal, round, and reactive to light.  Neck: Normal range of motion. Neck supple.  Cardiovascular: Normal rate, regular rhythm and intact distal pulses.   Pulmonary/Chest: Effort normal and breath sounds normal. She has no wheezes. She has no rales.  Abdominal: Soft. Bowel sounds are normal. There is no tenderness. There is no rebound and no guarding.  Musculoskeletal: Normal range of motion. She exhibits no edema and no tenderness.  Right foot neurovascularly intact cap refill < 2 sec to all toes  Neurological: She is alert and oriented to person, place, and time.  Skin: Skin is warm and dry.  Psychiatric: She has a normal mood and affect.    ED Course  Procedures (including critical care time) Labs Reviewed - No data to display Dg Foot Complete Right  10/25/2012   *RADIOLOGY REPORT*  Clinical Data: Foot pain.  RIGHT FOOT COMPLETE - 3+ VIEW  Comparison: 10/17/2012  Findings: Fracture of the distal fifth metatarsal is unchanged from the prior study.  No significant bony healing or displacement.  Surgically placed wires in the distal first metatarsal are unchanged.  There is mild degenerative change and spurring in the first MTP.  No new fracture identified.  IMPRESSION: Acute fracture fifth metatarsal unchanged.  No new fracture identified.   Original Report Authenticated By: Janeece Riggers, M.D.   1. Foot fracture, right, with delayed healing, subsequent encounter     MDM  Follow up with orthopedics for ongoing care  Viliami Bracco K Onell Mcmath-Rasch, MD 10/25/12 2339

## 2012-10-25 NOTE — ED Notes (Signed)
Pt injured her right foot apprx. 8 days ago and yesterday she stepped off of a stepping stone and twisted it, re-injuring it.

## 2012-12-08 ENCOUNTER — Emergency Department (HOSPITAL_BASED_OUTPATIENT_CLINIC_OR_DEPARTMENT_OTHER)
Admission: EM | Admit: 2012-12-08 | Discharge: 2012-12-08 | Disposition: A | Discharge status: Home / Self Care | Payer: Self-pay | Attending: Emergency Medicine | Admitting: Emergency Medicine

## 2012-12-08 ENCOUNTER — Encounter (HOSPITAL_BASED_OUTPATIENT_CLINIC_OR_DEPARTMENT_OTHER): Payer: Self-pay

## 2012-12-08 ENCOUNTER — Emergency Department (HOSPITAL_BASED_OUTPATIENT_CLINIC_OR_DEPARTMENT_OTHER): Payer: Self-pay

## 2012-12-08 DIAGNOSIS — F17200 Nicotine dependence, unspecified, uncomplicated: Secondary | ICD-10-CM | POA: Insufficient documentation

## 2012-12-08 DIAGNOSIS — S81009A Unspecified open wound, unspecified knee, initial encounter: Secondary | ICD-10-CM | POA: Insufficient documentation

## 2012-12-08 DIAGNOSIS — G8929 Other chronic pain: Secondary | ICD-10-CM | POA: Insufficient documentation

## 2012-12-08 DIAGNOSIS — Z79899 Other long term (current) drug therapy: Secondary | ICD-10-CM | POA: Insufficient documentation

## 2012-12-08 DIAGNOSIS — W268XXA Contact with other sharp object(s), not elsewhere classified, initial encounter: Secondary | ICD-10-CM | POA: Insufficient documentation

## 2012-12-08 DIAGNOSIS — Y9289 Other specified places as the place of occurrence of the external cause: Secondary | ICD-10-CM | POA: Insufficient documentation

## 2012-12-08 DIAGNOSIS — F4310 Post-traumatic stress disorder, unspecified: Secondary | ICD-10-CM | POA: Insufficient documentation

## 2012-12-08 DIAGNOSIS — Y9389 Activity, other specified: Secondary | ICD-10-CM | POA: Insufficient documentation

## 2012-12-08 DIAGNOSIS — F411 Generalized anxiety disorder: Secondary | ICD-10-CM | POA: Insufficient documentation

## 2012-12-08 DIAGNOSIS — IMO0002 Reserved for concepts with insufficient information to code with codable children: Secondary | ICD-10-CM

## 2012-12-08 DIAGNOSIS — S81809A Unspecified open wound, unspecified lower leg, initial encounter: Secondary | ICD-10-CM | POA: Insufficient documentation

## 2012-12-08 DIAGNOSIS — F319 Bipolar disorder, unspecified: Secondary | ICD-10-CM | POA: Insufficient documentation

## 2012-12-08 NOTE — ED Notes (Signed)
MD at bedside. 
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
## 2013-03-06 ENCOUNTER — Encounter (HOSPITAL_BASED_OUTPATIENT_CLINIC_OR_DEPARTMENT_OTHER): Payer: Self-pay | Admitting: Emergency Medicine

## 2013-03-06 ENCOUNTER — Emergency Department (HOSPITAL_BASED_OUTPATIENT_CLINIC_OR_DEPARTMENT_OTHER)
Admission: EM | Admit: 2013-03-06 | Discharge: 2013-03-06 | Disposition: A | Discharge status: Home / Self Care | Payer: Self-pay | Attending: Emergency Medicine | Admitting: Emergency Medicine

## 2013-03-06 DIAGNOSIS — Z79899 Other long term (current) drug therapy: Secondary | ICD-10-CM | POA: Insufficient documentation

## 2013-03-06 DIAGNOSIS — IMO0002 Reserved for concepts with insufficient information to code with codable children: Secondary | ICD-10-CM | POA: Insufficient documentation

## 2013-03-06 DIAGNOSIS — R6883 Chills (without fever): Secondary | ICD-10-CM | POA: Insufficient documentation

## 2013-03-06 DIAGNOSIS — F4310 Post-traumatic stress disorder, unspecified: Secondary | ICD-10-CM | POA: Insufficient documentation

## 2013-03-06 DIAGNOSIS — F17200 Nicotine dependence, unspecified, uncomplicated: Secondary | ICD-10-CM | POA: Insufficient documentation

## 2013-03-06 DIAGNOSIS — N12 Tubulo-interstitial nephritis, not specified as acute or chronic: Secondary | ICD-10-CM | POA: Insufficient documentation

## 2013-03-06 DIAGNOSIS — Z90710 Acquired absence of both cervix and uterus: Secondary | ICD-10-CM | POA: Insufficient documentation

## 2013-03-06 DIAGNOSIS — B9689 Other specified bacterial agents as the cause of diseases classified elsewhere: Secondary | ICD-10-CM

## 2013-03-06 DIAGNOSIS — F410 Panic disorder [episodic paroxysmal anxiety] without agoraphobia: Secondary | ICD-10-CM | POA: Insufficient documentation

## 2013-03-06 DIAGNOSIS — Z9089 Acquired absence of other organs: Secondary | ICD-10-CM | POA: Insufficient documentation

## 2013-03-06 DIAGNOSIS — N898 Other specified noninflammatory disorders of vagina: Secondary | ICD-10-CM | POA: Insufficient documentation

## 2013-03-06 DIAGNOSIS — G8929 Other chronic pain: Secondary | ICD-10-CM | POA: Insufficient documentation

## 2013-03-06 DIAGNOSIS — Z792 Long term (current) use of antibiotics: Secondary | ICD-10-CM | POA: Insufficient documentation

## 2013-03-06 DIAGNOSIS — M549 Dorsalgia, unspecified: Secondary | ICD-10-CM | POA: Insufficient documentation

## 2013-03-06 DIAGNOSIS — F319 Bipolar disorder, unspecified: Secondary | ICD-10-CM | POA: Insufficient documentation

## 2013-03-06 DIAGNOSIS — N760 Acute vaginitis: Secondary | ICD-10-CM | POA: Insufficient documentation

## 2013-03-06 LAB — COMPREHENSIVE METABOLIC PANEL
ALT: 7 U/L (ref 0–35)
Calcium: 9.3 mg/dL (ref 8.4–10.5)
GFR calc Af Amer: >90 mL/min (ref >90)
Glucose, Bld: 105 mg/dL — ABNORMAL HIGH (ref 70–99)
Sodium: 138 mEq/L (ref 135–145)
Total Protein: 7 g/dL (ref 6.0–8.3)

## 2013-03-06 LAB — CBC
Hemoglobin: 13 g/dL (ref 12.0–15.0)
MCH: 30.8 pg (ref 26.0–34.0)
MCHC: 32.9 g/dL (ref 30.0–36.0)
Platelets: 190 10*3/uL (ref 150–400)

## 2013-03-06 LAB — URINALYSIS, ROUTINE W REFLEX MICROSCOPIC
Glucose, UA: NEGATIVE mg/dL
pH: 5 (ref 5.0–8.0)

## 2013-03-06 LAB — WET PREP, GENITAL
Trich, Wet Prep: NONE SEEN
Yeast Wet Prep HPF POC: NONE SEEN

## 2013-03-06 MED ORDER — DEXTROSE 5 % IV SOLN
1.0000 g | Freq: Once | INTRAVENOUS | Status: AC
  Administered 2013-03-06: 1 g via INTRAVENOUS

## 2013-03-06 MED ORDER — CEPHALEXIN 500 MG PO CAPS: ORAL_CAPSULE | ORAL | Status: DC

## 2013-03-06 MED ORDER — HYDROMORPHONE HCL PF 1 MG/ML IJ SOLN
0.5000 mg | Freq: Once | INTRAMUSCULAR | Status: AC
  Administered 2013-03-06: 0.5 mg via INTRAVENOUS
  Filled 2013-03-06: qty 1

## 2013-03-06 MED ORDER — METRONIDAZOLE 500 MG PO TABS: 500.0000 mg | ORAL_TABLET | Freq: Two times a day (BID) | ORAL | Status: DC

## 2013-03-06 MED ORDER — SODIUM CHLORIDE 0.9 % IV BOLUS (SEPSIS)
1000.0000 mL | Freq: Once | INTRAVENOUS | Status: AC
  Administered 2013-03-06: 1000 mL via INTRAVENOUS

## 2013-03-06 MED ORDER — SODIUM CHLORIDE 0.9 % IV BOLUS (SEPSIS): 1000.0000 mL | Freq: Once | INTRAVENOUS | Status: DC

## 2013-03-06 MED ORDER — CEFTRIAXONE SODIUM 1 G IJ SOLR
INTRAMUSCULAR | Status: AC
  Filled 2013-03-06: qty 10

## 2013-03-06 MED ORDER — HYDROCODONE-ACETAMINOPHEN 5-325 MG PO TABS
1.0000 | ORAL_TABLET | Freq: Once | ORAL | Status: AC
  Administered 2013-03-06: 1 via ORAL
  Filled 2013-03-06: qty 1

## 2013-03-06 MED ORDER — METRONIDAZOLE IN NACL 5-0.79 MG/ML-% IV SOLN
500.0000 mg | Freq: Once | INTRAVENOUS | Status: AC
  Administered 2013-03-06: 500 mg via INTRAVENOUS
  Filled 2013-03-06: qty 100

## 2013-03-06 MED ORDER — HYDROCODONE-ACETAMINOPHEN 5-325 MG PO TABS: 1.0000 | ORAL_TABLET | Freq: Four times a day (QID) | ORAL | Status: DC | PRN

## 2013-03-06 NOTE — ED Notes (Signed)
Suprapubic pain and dysuria since yesterday.  Vaginal bleeding - hysterectomy in 2008.

## 2013-03-06 NOTE — ED Provider Notes (Signed)
CSN: [id number]     Arrival date & time 03/06/13  1816 History   First MD Initiated Contact with Patient 03/06/13 1823      This chart was scribed for Gerhard Munch, MD by Arlan Organ, ED Scribe. This patient was seen in room MH04/MH04 and the patient's care was started 6:33 PM.   Chief Complaint  Patient presents with  . Dysuria  . Vaginal Bleeding   The history is provided by the patient. No language interpreter was used.   HPI Comments: Beth Best is a 37 y.o. female who presents to the Emergency Department complaining of gradual onset, gradually worsening, constant dysuria that started a few weeks ago, but worsened yesterday. Pt states she has felt the urgency to urinate frequently. She also reports associated suprapubic pain and chills. Pt states she has noticed mild vaginal bleeding within the last few hours. She reports taking something prescribed for pain here in ED with no relief. She denies fever, emesis, diarrhea. She states she had a full hysterectomy 2008. She reports her last UTI was 2 months ago.  Past Medical History  Diagnosis Date  . Panic attacks   . Anxiety   . Chronic back pain   . Bipolar 1 disorder   . PTSD (post-traumatic stress disorder)    Past Surgical History  Procedure Laterality Date  . Cesarean section    . Abdominal hysterectomy    . Cholecystectomy     No family history on file. History  Substance Use Topics  . Smoking status: Current Some Day Smoker -- 0.50 packs/day    Types: Cigarettes  . Smokeless tobacco: Not on file  . Alcohol Use: No   OB History   Grav Para Term Preterm Abortions TAB SAB Ect Mult Living                 Review of Systems  Constitutional:       Per HPI, otherwise negative  HENT:       Per HPI, otherwise negative  Respiratory:       Per HPI, otherwise negative  Cardiovascular:       Per HPI, otherwise negative  Gastrointestinal: Negative for vomiting.  Endocrine:       Negative aside from HPI   Genitourinary:       Neg aside from HPI   Musculoskeletal:       Per HPI, otherwise negative  Skin: Negative.   Neurological: Negative for syncope.    Allergies  Review of patient's allergies indicates no known allergies.  Home Medications   Current Outpatient Rx  Name  Route  Sig  Dispense  Refill  . cephALEXin (KEFLEX) 500 MG capsule      2 caps po bid x 7 days   28 capsule   0   . ciprofloxacin (CIPRO) 500 MG tablet   Oral   Take 1 tablet (500 mg total) by mouth 2 (two) times daily.   14 tablet   0   . ciprofloxacin (CIPRO) 500 MG tablet   Oral   Take 1 tablet (500 mg total) by mouth 2 (two) times daily.   14 tablet   0   . citalopram (CELEXA) 40 MG tablet   Oral   Take 40 mg by mouth daily.         . clonazePAM (KLONOPIN) 1 MG tablet   Oral   Take 1 mg by mouth 2 (two) times daily as needed for anxiety.         Marland Kitchen  FLUoxetine (PROZAC) 20 MG tablet   Oral   Take 20 mg by mouth daily.         Marland Kitchen gabapentin (NEURONTIN) 300 MG capsule   Oral   Take 300 mg by mouth 3 (three) times daily.         Marland Kitchen HYDROcodone-acetaminophen (NORCO/VICODIN) 5-325 MG per tablet   Oral   Take 1 tablet by mouth every 6 (six) hours as needed for pain.   12 tablet   0   . hydrocortisone-pramoxine (PROCTOFOAM HC) rectal foam   Rectal   Place 1 applicator rectally 2 (two) times daily.   10 g   0   . ibuprofen (ADVIL,MOTRIN) 400 MG tablet   Oral   Take 1 tablet (400 mg total) by mouth every 6 (six) hours as needed for pain.   30 tablet   0   . metroNIDAZOLE (FLAGYL) 500 MG tablet   Oral   Take 1 tablet (500 mg total) by mouth 2 (two) times daily. One po bid x 7 days   14 tablet   1   . ondansetron (ZOFRAN ODT) 4 MG disintegrating tablet   Oral   Take 1 tablet (4 mg total) by mouth every 6 (six) hours as needed for nausea.   6 tablet   0   . oxyCODONE-acetaminophen (PERCOCET) 5-325 MG per tablet   Oral   Take 1 tablet by mouth every 6 (six) hours as needed  for pain.   10 tablet   0   . oxyCODONE-acetaminophen (PERCOCET/ROXICET) 5-325 MG per tablet   Oral   Take 1 tablet by mouth every 4 (four) hours as needed for pain.   20 tablet   0   . PERCOCET 5-325 MG per tablet   Oral   Take 1 tablet by mouth every 6 (six) hours as needed for pain.   15 tablet   0     Dispense as written.   . phenazopyridine (PYRIDIUM) 200 MG tablet   Oral   Take 1 tablet (200 mg total) by mouth 3 (three) times daily.   9 tablet   0   . prazosin (MINIPRESS) 1 MG capsule   Oral   Take 1 mg by mouth at bedtime.         . traZODone (DESYREL) 100 MG tablet   Oral   Take 100 mg by mouth at bedtime.         Marland Kitchen UNKNOWN TO PATIENT      "mood stabilizer"-unsure of name due to is new med          BP 129/60  Pulse 81  Temp(Src) 98.3 F (36.8 C) (Oral)  Resp 16  Ht 5\' 6"  (1.676 m)  Wt 125 lb (56.7 kg)  BMI 20.19 kg/m2  SpO2 100% Physical Exam  Nursing note and vitals reviewed. Constitutional: She is oriented to person, place, and time. She appears well-developed and well-nourished. No distress.  HENT:  Head: Normocephalic and atraumatic.  Eyes: Conjunctivae and EOM are normal.  Cardiovascular: Normal rate and regular rhythm.   Pulmonary/Chest: Effort normal and breath sounds normal. No stridor. No respiratory distress.  Abdominal: She exhibits no distension. Hernia confirmed negative in the right inguinal area and confirmed negative in the left inguinal area.  Genitourinary: No erythema, tenderness or bleeding around the vagina. No foreign body around the vagina. Vaginal discharge found.  Suprapubic discomfort  Musculoskeletal: She exhibits no edema.  Lymphadenopathy:       Right: No inguinal  adenopathy present.       Left: No inguinal adenopathy present.  Neurological: She is alert and oriented to person, place, and time. No cranial nerve deficit.  Skin: Skin is warm and dry.  Psychiatric: She has a normal mood and affect.    ED Course   Pelvic exam Date/Time: 03/06/2013 6:30 PM Performed by: Gerhard Munch Authorized by: Gerhard Munch Consent: The procedure was performed in an emergent situation. Risks and benefits: risks, benefits and alternatives were discussed Consent given by: patient Patient understanding: patient states understanding of the procedure being performed Patient consent: the patient's understanding of the procedure matches consent given Procedure consent: procedure consent matches procedure scheduled Relevant documents: relevant documents present and verified Test results: test results available and properly labeled Site marked: the operative site was marked Imaging studies: imaging studies available Required items: required blood products, implants, devices, and special equipment available Patient identity confirmed: verbally with patient Time out: Immediately prior to procedure a "time out" was called to verify the correct patient, procedure, equipment, support staff and site/side marked as required. Preparation: Patient was prepped and draped in the usual sterile fashion. Local anesthesia used: no Patient sedated: no Patient tolerance: Patient tolerated the procedure well with no immediate complications.   (including critical care time)  DIAGNOSTIC STUDIES: Oxygen Saturation is 100% on RA, Normal by my interpretation.    COORDINATION OF CARE: 6:35 PM- Will order urinalysis. Discussed treatment plan with pt at bedside and pt agreed to plan.     Labs Review Labs Reviewed  URINALYSIS, ROUTINE W REFLEX MICROSCOPIC   Imaging Review No results found.  EKG Interpretation   None       7:43 PM Patient continues to c/o pain.  Labs notable for UTI / BV / ketonuria - IVF ordered w ABX   11:08 PM Patient has ventilatory, is eating and drinking, with no subsequent nausea or vomiting.  She states that she feels substantially better. MDM  No diagnosis found. The patient presents with  ongoing suprapubic pain, dysuria, possible vaginal bleeding.  Notably, the patient has a prior hysterectomy.  On exam the patient is awake and alert, afebrile, but uncomfortable appearing.  She does have tenderness to palpation about the suprapubic area, and discharged from her vagina.  Patient's initial labs are notable for evidence of bacterial vaginosis and urinary tract infection.  With ketonuria, or some suspicion for dehydration, possible cystitis versus early pyelonephritis.  Patient received IV fluids, antibiotics.  She had substantial improvement in her condition.  She was discharged in stable condition to follow up with women's outpatient clinic.  Gerhard Munch, MD 03/06/13 2308

## 2013-03-06 NOTE — ED Notes (Signed)
Pt calling family for ride home.

## 2013-03-07 LAB — URINE CULTURE
Colony Count: NO GROWTH
Culture: NO GROWTH

## 2013-03-07 LAB — GC/CHLAMYDIA PROBE AMP
CT Probe RNA: NEGATIVE
GC Probe RNA: NEGATIVE

## 2013-03-14 ENCOUNTER — Encounter (HOSPITAL_BASED_OUTPATIENT_CLINIC_OR_DEPARTMENT_OTHER): Payer: Self-pay | Admitting: Emergency Medicine

## 2013-03-14 ENCOUNTER — Emergency Department (HOSPITAL_BASED_OUTPATIENT_CLINIC_OR_DEPARTMENT_OTHER)
Admission: EM | Admit: 2013-03-14 | Discharge: 2013-03-15 | Disposition: A | Discharge status: Home / Self Care | Payer: Self-pay | Attending: Emergency Medicine | Admitting: Emergency Medicine

## 2013-03-14 DIAGNOSIS — N949 Unspecified condition associated with female genital organs and menstrual cycle: Secondary | ICD-10-CM | POA: Insufficient documentation

## 2013-03-14 DIAGNOSIS — Z792 Long term (current) use of antibiotics: Secondary | ICD-10-CM | POA: Insufficient documentation

## 2013-03-14 DIAGNOSIS — F4310 Post-traumatic stress disorder, unspecified: Secondary | ICD-10-CM | POA: Insufficient documentation

## 2013-03-14 DIAGNOSIS — Z79899 Other long term (current) drug therapy: Secondary | ICD-10-CM | POA: Insufficient documentation

## 2013-03-14 DIAGNOSIS — IMO0002 Reserved for concepts with insufficient information to code with codable children: Secondary | ICD-10-CM | POA: Insufficient documentation

## 2013-03-14 DIAGNOSIS — F17200 Nicotine dependence, unspecified, uncomplicated: Secondary | ICD-10-CM | POA: Insufficient documentation

## 2013-03-14 DIAGNOSIS — F410 Panic disorder [episodic paroxysmal anxiety] without agoraphobia: Secondary | ICD-10-CM | POA: Insufficient documentation

## 2013-03-14 DIAGNOSIS — Z87440 Personal history of urinary (tract) infections: Secondary | ICD-10-CM | POA: Insufficient documentation

## 2013-03-14 DIAGNOSIS — R102 Pelvic and perineal pain: Secondary | ICD-10-CM

## 2013-03-14 DIAGNOSIS — G8929 Other chronic pain: Secondary | ICD-10-CM | POA: Insufficient documentation

## 2013-03-14 DIAGNOSIS — Z90710 Acquired absence of both cervix and uterus: Secondary | ICD-10-CM | POA: Insufficient documentation

## 2013-03-14 DIAGNOSIS — F319 Bipolar disorder, unspecified: Secondary | ICD-10-CM | POA: Insufficient documentation

## 2013-03-14 LAB — URINE MICROSCOPIC-ADD ON

## 2013-03-14 LAB — URINALYSIS, ROUTINE W REFLEX MICROSCOPIC
Bilirubin Urine: NEGATIVE
Nitrite: NEGATIVE
Specific Gravity, Urine: 1.018 (ref 1.005–1.030)
pH: 6 (ref 5.0–8.0)

## 2013-03-14 MED ORDER — PHENAZOPYRIDINE HCL 200 MG PO TABS: 200.0000 mg | ORAL_TABLET | Freq: Three times a day (TID) | ORAL | Status: DC | PRN

## 2013-03-14 MED ORDER — NITROFURANTOIN MONOHYD MACRO 100 MG PO CAPS: 100.0000 mg | ORAL_CAPSULE | Freq: Two times a day (BID) | ORAL | Status: DC

## 2013-03-14 MED ORDER — NITROFURANTOIN MONOHYD MACRO 100 MG PO CAPS
100.0000 mg | ORAL_CAPSULE | Freq: Once | ORAL | Status: AC
  Administered 2013-03-14: 100 mg via ORAL
  Filled 2013-03-14: qty 1

## 2013-03-14 MED ORDER — PHENAZOPYRIDINE HCL 100 MG PO TABS
200.0000 mg | ORAL_TABLET | Freq: Once | ORAL | Status: AC
  Administered 2013-03-14: 200 mg via ORAL
  Filled 2013-03-14: qty 2

## 2013-03-14 NOTE — ED Notes (Signed)
Lower abdominal pressure. States she was treated for a UTI and finished her last Keflex 500mg  this am.

## 2013-03-14 NOTE — ED Provider Notes (Addendum)
CSN: [id number]     Arrival date & time 03/14/13  2139 History  This chart was scribed for Hanley Seamen, MD by Danella Maiers, ED Scribe. This patient was seen in room MH08/MH08 and the patient's care was started at 11:31 PM.    Chief Complaint  Patient presents with  . Abdominal Pain   HPI HPI Comments: Beth Best is a 37 y.o. female who presents to the Emergency Department complaining of suprabupic abdominal pain described as pressure and bilateral flank pain described as throbbing. She was seen and treated here for a UTI on 11/3 and finished her last Keflex 500mg  this morning. She states her symptoms tonight feel the same as they did on 11/3. She reports chills last night. She has a h/o full hysterectomy due to endometriosis in 2008. She has not seen an OBGYN since. She has been referred to a urologist and is waiting to get insurance to f/u with them. She denies vaginal bleeding. She has had some relief with over-the-counter phenazopyridine.   Past Medical History  Diagnosis Date  . Panic attacks   . Anxiety   . Chronic back pain   . Bipolar 1 disorder   . PTSD (post-traumatic stress disorder)    Past Surgical History  Procedure Laterality Date  . Cesarean section    . Abdominal hysterectomy    . Cholecystectomy     No family history on file. History  Substance Use Topics  . Smoking status: Current Some Day Smoker -- 0.50 packs/day    Types: Cigarettes  . Smokeless tobacco: Not on file  . Alcohol Use: No   OB History   Grav Para Term Preterm Abortions TAB SAB Ect Mult Living                 Review of Systems  Constitutional: Positive for chills. Negative for fever.  Gastrointestinal: Positive for abdominal pain.  Genitourinary: Positive for flank pain.   A complete 10 system review of systems was obtained and all systems are negative except as noted in the HPI and PMH.   Allergies  Review of patient's allergies indicates no known allergies.  Home  Medications   Current Outpatient Rx  Name  Route  Sig  Dispense  Refill  . cephALEXin (KEFLEX) 500 MG capsule      2 caps po bid x 7 days   28 capsule   0   . ciprofloxacin (CIPRO) 500 MG tablet   Oral   Take 1 tablet (500 mg total) by mouth 2 (two) times daily.   14 tablet   0   . ciprofloxacin (CIPRO) 500 MG tablet   Oral   Take 1 tablet (500 mg total) by mouth 2 (two) times daily.   14 tablet   0   . citalopram (CELEXA) 40 MG tablet   Oral   Take 40 mg by mouth daily.         . clonazePAM (KLONOPIN) 1 MG tablet   Oral   Take 1 mg by mouth 2 (two) times daily as needed for anxiety.         Marland Kitchen FLUoxetine (PROZAC) 20 MG tablet   Oral   Take 20 mg by mouth daily.         Marland Kitchen gabapentin (NEURONTIN) 300 MG capsule   Oral   Take 300 mg by mouth 3 (three) times daily.         Marland Kitchen HYDROcodone-acetaminophen (NORCO/VICODIN) 5-325 MG per tablet   Oral  Take 1 tablet by mouth every 6 (six) hours as needed for pain.   15 tablet   0   . hydrocortisone-pramoxine (PROCTOFOAM HC) rectal foam   Rectal   Place 1 applicator rectally 2 (two) times daily.   10 g   0   . ibuprofen (ADVIL,MOTRIN) 400 MG tablet   Oral   Take 1 tablet (400 mg total) by mouth every 6 (six) hours as needed for pain.   30 tablet   0   . metroNIDAZOLE (FLAGYL) 500 MG tablet   Oral   Take 1 tablet (500 mg total) by mouth 2 (two) times daily. One po bid x 7 days   14 tablet   1   . metroNIDAZOLE (FLAGYL) 500 MG tablet   Oral   Take 1 tablet (500 mg total) by mouth 2 (two) times daily.   14 tablet   0   . ondansetron (ZOFRAN ODT) 4 MG disintegrating tablet   Oral   Take 1 tablet (4 mg total) by mouth every 6 (six) hours as needed for nausea.   6 tablet   0   . oxyCODONE-acetaminophen (PERCOCET) 5-325 MG per tablet   Oral   Take 1 tablet by mouth every 6 (six) hours as needed for pain.   10 tablet   0   . oxyCODONE-acetaminophen (PERCOCET/ROXICET) 5-325 MG per tablet   Oral    Take 1 tablet by mouth every 4 (four) hours as needed for pain.   20 tablet   0   . PERCOCET 5-325 MG per tablet   Oral   Take 1 tablet by mouth every 6 (six) hours as needed for pain.   15 tablet   0     Dispense as written.   . phenazopyridine (PYRIDIUM) 200 MG tablet   Oral   Take 1 tablet (200 mg total) by mouth 3 (three) times daily.   9 tablet   0   . prazosin (MINIPRESS) 1 MG capsule   Oral   Take 1 mg by mouth at bedtime.         . traZODone (DESYREL) 100 MG tablet   Oral   Take 100 mg by mouth at bedtime.         Marland Kitchen UNKNOWN TO PATIENT      "mood stabilizer"-unsure of name due to is new med          BP 129/75  Pulse 74  Temp(Src) 98.7 F (37.1 C) (Oral)  Resp 20  Ht 5\' 6"  (1.676 m)  Wt 125 lb (56.7 kg)  BMI 20.19 kg/m2  SpO2 100% Physical Exam General: Well-developed, well-nourished female in no acute distress; appearance consistent with age of record HENT: normocephalic; atraumatic Eyes: pupils equal, round and reactive to light; extraocular muscles intact Neck: supple Heart: regular rate and rhythm; no murmurs, rubs or gallops Lungs: clear to auscultation bilaterally Abdomen: soft; nondistended; nontender; no masses or hepatosplenomegaly; bowel sounds present. Tenderness across the lower abdomen, most significant in the suprapubic region GU: mild bilateral flank tenderness Extremities: No deformity; full range of motion; pulses normal Neurologic: Awake, alert and oriented; motor function intact in all extremities and symmetric; no facial droop Skin: Warm and dry Psychiatric: Normal mood and affect  ED Course  Procedures (including critical care time) DIAGNOSTIC STUDIES: Oxygen Saturation is 100% on RA, normal by my interpretation.    COORDINATION OF CARE: 11:46 PM- Discussed treatment plan with pt. Pt agrees to plan.   MDM   Nursing  notes and vitals signs, including pulse oximetry, reviewed.  Summary of this visit's results, reviewed  by myself:  Labs:  Results for orders placed during the hospital encounter of 03/14/13 (from the past 24 hour(s))  URINALYSIS, ROUTINE W REFLEX MICROSCOPIC     Status: Abnormal   Collection Time    03/14/13  9:40 PM      Result Value Range   Color, Urine YELLOW  YELLOW   APPearance CLEAR  CLEAR   Specific Gravity, Urine 1.018  1.005 - 1.030   pH 6.0  5.0 - 8.0   Glucose, UA NEGATIVE  NEGATIVE mg/dL   Hgb urine dipstick NEGATIVE  NEGATIVE   Bilirubin Urine NEGATIVE  NEGATIVE   Ketones, ur NEGATIVE  NEGATIVE mg/dL   Protein, ur NEGATIVE  NEGATIVE mg/dL   Urobilinogen, UA 0.2  0.0 - 1.0 mg/dL   Nitrite NEGATIVE  NEGATIVE   Leukocytes, UA SMALL (*) NEGATIVE  URINE MICROSCOPIC-ADD ON     Status: None   Collection Time    03/14/13  9:40 PM      Result Value Range   Squamous Epithelial / LPF RARE  RARE   WBC, UA 3-6  <3 WBC/hpf   Bacteria, UA RARE  RARE   The patient's urinalysis suggests a resolving urinary tract infection. It is noted that her urine culture she was treated on the third of this month grew out no bacteria. She has a history of total hysterectomy for severe endometriosis; this included removal of her appendix. We will trial another course of antibiotics for possible incompletely treated urinary tract infection. She was encouraged to followup with urology; she has a referral pending for frequent urinary tract infections but is waiting for Medicaid.  She was advised to return should her pain worsen then we would consider CT scan at that time.    I personally performed the services described in this documentation, which was scribed in my presence.  The recorded information has been reviewed and considered.    Hanley Seamen, MD 03/14/13 2349  Hanley Seamen, MD 03/14/13 2350  Hanley Seamen, MD 03/14/13 [phone]

## 2013-04-15 ENCOUNTER — Emergency Department (HOSPITAL_BASED_OUTPATIENT_CLINIC_OR_DEPARTMENT_OTHER)
Admission: EM | Admit: 2013-04-15 | Discharge: 2013-04-15 | Disposition: A | Discharge status: Home / Self Care | Payer: Self-pay | Attending: Emergency Medicine | Admitting: Emergency Medicine

## 2013-04-15 ENCOUNTER — Encounter (HOSPITAL_BASED_OUTPATIENT_CLINIC_OR_DEPARTMENT_OTHER): Payer: Self-pay | Admitting: Emergency Medicine

## 2013-04-15 DIAGNOSIS — G8929 Other chronic pain: Secondary | ICD-10-CM | POA: Insufficient documentation

## 2013-04-15 DIAGNOSIS — F319 Bipolar disorder, unspecified: Secondary | ICD-10-CM | POA: Insufficient documentation

## 2013-04-15 DIAGNOSIS — Z3202 Encounter for pregnancy test, result negative: Secondary | ICD-10-CM | POA: Insufficient documentation

## 2013-04-15 DIAGNOSIS — N12 Tubulo-interstitial nephritis, not specified as acute or chronic: Secondary | ICD-10-CM | POA: Insufficient documentation

## 2013-04-15 DIAGNOSIS — Z9889 Other specified postprocedural states: Secondary | ICD-10-CM | POA: Insufficient documentation

## 2013-04-15 DIAGNOSIS — Z79899 Other long term (current) drug therapy: Secondary | ICD-10-CM | POA: Insufficient documentation

## 2013-04-15 DIAGNOSIS — F411 Generalized anxiety disorder: Secondary | ICD-10-CM | POA: Insufficient documentation

## 2013-04-15 DIAGNOSIS — Z9089 Acquired absence of other organs: Secondary | ICD-10-CM | POA: Insufficient documentation

## 2013-04-15 DIAGNOSIS — F17200 Nicotine dependence, unspecified, uncomplicated: Secondary | ICD-10-CM | POA: Insufficient documentation

## 2013-04-15 DIAGNOSIS — F4310 Post-traumatic stress disorder, unspecified: Secondary | ICD-10-CM | POA: Insufficient documentation

## 2013-04-15 DIAGNOSIS — Z90710 Acquired absence of both cervix and uterus: Secondary | ICD-10-CM | POA: Insufficient documentation

## 2013-04-15 LAB — URINE MICROSCOPIC-ADD ON

## 2013-04-15 LAB — URINALYSIS, ROUTINE W REFLEX MICROSCOPIC
Hgb urine dipstick: NEGATIVE
Ketones, ur: 40 mg/dL — AB
Specific Gravity, Urine: 1.021 (ref 1.005–1.030)
Urobilinogen, UA: >8 mg/dL — ABNORMAL HIGH (ref 0.0–1.0)

## 2013-04-15 LAB — PREGNANCY, URINE: Preg Test, Ur: NEGATIVE

## 2013-04-15 MED ORDER — HYDROCODONE-ACETAMINOPHEN 5-325 MG PO TABS
1.0000 | ORAL_TABLET | Freq: Once | ORAL | Status: AC
  Administered 2013-04-15: 1 via ORAL
  Filled 2013-04-15: qty 1

## 2013-04-15 MED ORDER — CIPROFLOXACIN HCL 500 MG PO TABS: 500.0000 mg | ORAL_TABLET | Freq: Two times a day (BID) | ORAL | Status: DC

## 2013-04-15 MED ORDER — CEFTRIAXONE SODIUM 1 G IJ SOLR
1.0000 g | Freq: Once | INTRAMUSCULAR | Status: AC
  Administered 2013-04-15: 1 g via INTRAMUSCULAR
  Filled 2013-04-15: qty 10

## 2013-04-15 MED ORDER — PHENAZOPYRIDINE HCL 200 MG PO TABS: 200.0000 mg | ORAL_TABLET | Freq: Three times a day (TID) | ORAL | Status: DC

## 2013-04-15 MED ORDER — LIDOCAINE HCL (PF) 1 % IJ SOLN
INTRAMUSCULAR | Status: AC
  Administered 2013-04-15: 23:00:00
  Filled 2013-04-15: qty 5

## 2013-04-15 NOTE — ED Provider Notes (Signed)
CSN: [id number]     Arrival date & time 04/15/13  2133 History   First MD Initiated Contact with Patient 04/15/13 2204     Chief Complaint  Patient presents with  . Pelvic Pain   (Consider location/radiation/quality/duration/timing/severity/associated sxs/prior Treatment) Patient is a 37 y.o. female presenting with pelvic pain. The history is provided by the patient.  Pelvic Pain This is a new problem. The current episode started yesterday. The problem occurs constantly. The problem has been gradually worsening. Associated symptoms include chills and a fever. Pertinent negatives include no headaches, nausea, neck pain, rash or vomiting.   Beth Best is a 37 y.o. female who presents to the ED with UTI symptoms that started yesterday. She has had UTI's in the past. She denies vaginal discharge. She has had a hysterectomy. She has urinary frequency, urgency, dysuria and suprapubic pain.   Past Medical History  Diagnosis Date  . Panic attacks   . Anxiety   . Chronic back pain   . Bipolar 1 disorder   . PTSD (post-traumatic stress disorder)    Past Surgical History  Procedure Laterality Date  . Cesarean section    . Abdominal hysterectomy    . Cholecystectomy     History reviewed. No pertinent family history. History  Substance Use Topics  . Smoking status: Current Some Day Smoker -- 0.50 packs/day    Types: Cigarettes  . Smokeless tobacco: Not on file  . Alcohol Use: No   OB History   Grav Para Term Preterm Abortions TAB SAB Ect Mult Living                 Review of Systems  Constitutional: Positive for fever and chills.  Gastrointestinal: Negative for nausea and vomiting.  Genitourinary: Positive for dysuria, urgency, frequency, hematuria, flank pain and pelvic pain. Negative for vaginal bleeding and vaginal discharge.  Musculoskeletal: Positive for back pain. Negative for neck pain.  Skin: Negative for rash.  Neurological: Negative for dizziness and headaches.   Psychiatric/Behavioral: The patient is not nervous/anxious.     Allergies  Review of patient's allergies indicates no known allergies.  Home Medications   Current Outpatient Rx  Name  Route  Sig  Dispense  Refill  . citalopram (CELEXA) 40 MG tablet   Oral   Take 40 mg by mouth daily.         . clonazePAM (KLONOPIN) 1 MG tablet   Oral   Take 1 mg by mouth 2 (two) times daily as needed for anxiety.         Marland Kitchen FLUoxetine (PROZAC) 20 MG tablet   Oral   Take 20 mg by mouth daily.         Marland Kitchen gabapentin (NEURONTIN) 300 MG capsule   Oral   Take 300 mg by mouth 3 (three) times daily.         Marland Kitchen HYDROcodone-acetaminophen (NORCO/VICODIN) 5-325 MG per tablet   Oral   Take 1 tablet by mouth every 6 (six) hours as needed for pain.   15 tablet   0   . hydrocortisone-pramoxine (PROCTOFOAM HC) rectal foam   Rectal   Place 1 applicator rectally 2 (two) times daily.   10 g   0   . ibuprofen (ADVIL,MOTRIN) 400 MG tablet   Oral   Take 1 tablet (400 mg total) by mouth every 6 (six) hours as needed for pain.   30 tablet   0   . ondansetron (ZOFRAN ODT) 4 MG disintegrating tablet  Oral   Take 1 tablet (4 mg total) by mouth every 6 (six) hours as needed for nausea.   6 tablet   0   . oxyCODONE-acetaminophen (PERCOCET) 5-325 MG per tablet   Oral   Take 1 tablet by mouth every 6 (six) hours as needed for pain.   10 tablet   0   . oxyCODONE-acetaminophen (PERCOCET/ROXICET) 5-325 MG per tablet   Oral   Take 1 tablet by mouth every 4 (four) hours as needed for pain.   20 tablet   0   . PERCOCET 5-325 MG per tablet   Oral   Take 1 tablet by mouth every 6 (six) hours as needed for pain.   15 tablet   0     Dispense as written.   . prazosin (MINIPRESS) 1 MG capsule   Oral   Take 1 mg by mouth at bedtime.         . traZODone (DESYREL) 100 MG tablet   Oral   Take 100 mg by mouth at bedtime.         . ciprofloxacin (CIPRO) 500 MG tablet   Oral   Take 1 tablet  (500 mg total) by mouth every 12 (twelve) hours.   14 tablet   0   . phenazopyridine (PYRIDIUM) 200 MG tablet   Oral   Take 1 tablet (200 mg total) by mouth 3 (three) times daily.   6 tablet   0   . UNKNOWN TO PATIENT      "mood stabilizer"-unsure of name due to is new med          BP 121/80  Pulse 78  Temp(Src) 99 F (37.2 C) (Oral)  Resp 16  Wt 125 lb (56.7 kg)  SpO2 97% Physical Exam  Nursing note and vitals reviewed. Constitutional: She is oriented to person, place, and time. She appears well-developed and well-nourished. No distress.  HENT:  Head: Normocephalic.  Eyes: EOM are normal.  Neck: Neck supple.  Cardiovascular: Normal rate.   Pulmonary/Chest: Effort normal.  Abdominal: Soft. There is tenderness in the suprapubic area. There is CVA tenderness (right ). There is no rebound and no guarding.  Musculoskeletal: Normal range of motion.  Neurological: She is alert and oriented to person, place, and time. No cranial nerve deficit.  Skin: Skin is warm and dry.  Psychiatric: She has a normal mood and affect. Her behavior is normal.   Results for orders placed during the hospital encounter of 04/15/13 (from the past 24 hour(s))  URINALYSIS, ROUTINE W REFLEX MICROSCOPIC     Status: Abnormal   Collection Time    04/15/13  9:50 PM      Result Value Range   Color, Urine RED (*) YELLOW   APPearance CLOUDY (*) CLEAR   Specific Gravity, Urine 1.021  1.005 - 1.030   pH 5.0  5.0 - 8.0   Glucose, UA NEGATIVE  NEGATIVE mg/dL   Hgb urine dipstick NEGATIVE  NEGATIVE   Bilirubin Urine MODERATE (*) NEGATIVE   Ketones, ur 40 (*) NEGATIVE mg/dL   Protein, ur 30 (*) NEGATIVE mg/dL   Urobilinogen, UA >4.0 (*) 0.0 - 1.0 mg/dL   Nitrite POSITIVE (*) NEGATIVE   Leukocytes, UA LARGE (*) NEGATIVE  PREGNANCY, URINE     Status: None   Collection Time    04/15/13  9:50 PM      Result Value Range   Preg Test, Ur NEGATIVE  NEGATIVE  URINE MICROSCOPIC-ADD ON  Status: Abnormal    Collection Time    04/15/13  9:50 PM      Result Value Range   Squamous Epithelial / LPF FEW (*) RARE   WBC, UA 21-50  <3 WBC/hpf   RBC / HPF 0-2  <3 RBC/hpf   Bacteria, UA FEW (*) RARE   Urine-Other MUCOUS PRESENT      ED Course  Procedures  MDM   1. Pyelonephritis    37 y.o. female with dysuria, frequency, urgency, low grade fever x 24 hours. Will treat for out patient pyelonephritis. She will return for any problems.  Discussed with the patient and all questioned fully answered.    Medication List    STOP taking these medications       cephALEXin 500 MG capsule  Commonly known as:  KEFLEX     metroNIDAZOLE 500 MG tablet  Commonly known as:  FLAGYL     nitrofurantoin (macrocrystal-monohydrate) 100 MG capsule  Commonly known as:  MACROBID      TAKE these medications       ciprofloxacin 500 MG tablet  Commonly known as:  CIPRO  Take 1 tablet (500 mg total) by mouth every 12 (twelve) hours.     phenazopyridine 200 MG tablet  Commonly known as:  PYRIDIUM  Take 1 tablet (200 mg total) by mouth 3 (three) times daily.      ASK your doctor about these medications       citalopram 40 MG tablet  Commonly known as:  CELEXA  Take 40 mg by mouth daily.     FLUoxetine 20 MG tablet  Commonly known as:  PROZAC  Take 20 mg by mouth daily.     gabapentin 300 MG capsule  Commonly known as:  NEURONTIN  Take 300 mg by mouth 3 (three) times daily.     HYDROcodone-acetaminophen 5-325 MG per tablet  Commonly known as:  NORCO/VICODIN  Take 1 tablet by mouth every 6 (six) hours as needed for pain.     hydrocortisone-pramoxine rectal foam  Commonly known as:  PROCTOFOAM HC  Place 1 applicator rectally 2 (two) times daily.     ibuprofen 400 MG tablet  Commonly known as:  ADVIL,MOTRIN  Take 1 tablet (400 mg total) by mouth every 6 (six) hours as needed for pain.     KLONOPIN 1 MG tablet  Generic drug:  clonazePAM  Take 1 mg by mouth 2 (two) times daily as needed for  anxiety.     ondansetron 4 MG disintegrating tablet  Commonly known as:  ZOFRAN ODT  Take 1 tablet (4 mg total) by mouth every 6 (six) hours as needed for nausea.     PERCOCET 5-325 MG per tablet  Generic drug:  oxyCODONE-acetaminophen  Take 1 tablet by mouth every 6 (six) hours as needed for pain.     oxyCODONE-acetaminophen 5-325 MG per tablet  Commonly known as:  PERCOCET/ROXICET  Take 1 tablet by mouth every 4 (four) hours as needed for pain.     oxyCODONE-acetaminophen 5-325 MG per tablet  Commonly known as:  PERCOCET  Take 1 tablet by mouth every 6 (six) hours as needed for pain.     prazosin 1 MG capsule  Commonly known as:  MINIPRESS  Take 1 mg by mouth at bedtime.     traZODone 100 MG tablet  Commonly known as:  DESYREL  Take 100 mg by mouth at bedtime.     UNKNOWN TO PATIENT  "mood stabilizer"-unsure of name due  to is new med                                     Janne Napoleon, NP 04/15/13 [phone]

## 2013-04-15 NOTE — ED Notes (Signed)
No adverse effects noted to IM injection.  

## 2013-04-15 NOTE — ED Notes (Signed)
Pt reports pelvic pain and pain with urination onset 1 day ago and worse today possible blood in urine

## 2013-04-16 NOTE — ED Provider Notes (Signed)
Medical screening examination/treatment/procedure(s) were performed by non-physician practitioner and as supervising physician I was immediately available for consultation/collaboration.  EKG Interpretation   None         Katrine Radich M Monic Engelmann, MD 04/16/13 0057 

## 2013-04-18 LAB — URINE CULTURE

## 2013-10-27 ENCOUNTER — Emergency Department (HOSPITAL_BASED_OUTPATIENT_CLINIC_OR_DEPARTMENT_OTHER)
Admission: EM | Admit: 2013-10-27 | Discharge: 2013-10-27 | Disposition: A | Discharge status: Home / Self Care | Payer: Self-pay | Attending: Emergency Medicine | Admitting: Emergency Medicine

## 2013-10-27 ENCOUNTER — Encounter (HOSPITAL_BASED_OUTPATIENT_CLINIC_OR_DEPARTMENT_OTHER): Payer: Self-pay | Admitting: Emergency Medicine

## 2013-10-27 DIAGNOSIS — F410 Panic disorder [episodic paroxysmal anxiety] without agoraphobia: Secondary | ICD-10-CM | POA: Insufficient documentation

## 2013-10-27 DIAGNOSIS — F4310 Post-traumatic stress disorder, unspecified: Secondary | ICD-10-CM | POA: Insufficient documentation

## 2013-10-27 DIAGNOSIS — Z791 Long term (current) use of non-steroidal anti-inflammatories (NSAID): Secondary | ICD-10-CM | POA: Insufficient documentation

## 2013-10-27 DIAGNOSIS — Z79899 Other long term (current) drug therapy: Secondary | ICD-10-CM | POA: Insufficient documentation

## 2013-10-27 DIAGNOSIS — F319 Bipolar disorder, unspecified: Secondary | ICD-10-CM | POA: Insufficient documentation

## 2013-10-27 DIAGNOSIS — G8929 Other chronic pain: Secondary | ICD-10-CM | POA: Insufficient documentation

## 2013-10-27 DIAGNOSIS — Z792 Long term (current) use of antibiotics: Secondary | ICD-10-CM | POA: Insufficient documentation

## 2013-10-27 DIAGNOSIS — F17200 Nicotine dependence, unspecified, uncomplicated: Secondary | ICD-10-CM | POA: Insufficient documentation

## 2013-10-27 DIAGNOSIS — N12 Tubulo-interstitial nephritis, not specified as acute or chronic: Secondary | ICD-10-CM | POA: Insufficient documentation

## 2013-10-27 LAB — URINE MICROSCOPIC-ADD ON

## 2013-10-27 LAB — URINALYSIS, ROUTINE W REFLEX MICROSCOPIC
GLUCOSE, UA: NEGATIVE mg/dL
HGB URINE DIPSTICK: NEGATIVE
Ketones, ur: 40 mg/dL — AB
Nitrite: POSITIVE — AB
PH: 5 (ref 5.0–8.0)
Protein, ur: 100 mg/dL — AB
SPECIFIC GRAVITY, URINE: 1.028 (ref 1.005–1.030)
Urobilinogen, UA: >8 mg/dL — ABNORMAL HIGH (ref 0.0–1.0)

## 2013-10-27 MED ORDER — PHENAZOPYRIDINE HCL 200 MG PO TABS: 200.0000 mg | ORAL_TABLET | Freq: Three times a day (TID) | ORAL | Status: DC

## 2013-10-27 MED ORDER — SODIUM CHLORIDE 0.9 % IV BOLUS (SEPSIS)
1000.0000 mL | Freq: Once | INTRAVENOUS | Status: AC
  Administered 2013-10-27: 1000 mL via INTRAVENOUS

## 2013-10-27 MED ORDER — CIPROFLOXACIN HCL 500 MG PO TABS: 500.0000 mg | ORAL_TABLET | Freq: Two times a day (BID) | ORAL | Status: DC

## 2013-10-27 MED ORDER — CEFTRIAXONE SODIUM 1 G IJ SOLR
INTRAMUSCULAR | Status: AC
  Filled 2013-10-27: qty 10

## 2013-10-27 MED ORDER — CEFTRIAXONE SODIUM 1 G IJ SOLR
1.0000 g | Freq: Once | INTRAMUSCULAR | Status: AC
  Administered 2013-10-27: 1 g via INTRAVENOUS

## 2013-10-27 MED ORDER — TRAMADOL HCL 50 MG PO TABS: 50.0000 mg | ORAL_TABLET | Freq: Four times a day (QID) | ORAL | Status: DC | PRN

## 2013-10-27 MED ORDER — ONDANSETRON HCL 4 MG/2ML IJ SOLN
4.0000 mg | Freq: Once | INTRAMUSCULAR | Status: AC
  Administered 2013-10-27: 4 mg via INTRAVENOUS
  Filled 2013-10-27: qty 2

## 2013-10-27 MED ORDER — MORPHINE SULFATE 4 MG/ML IJ SOLN
4.0000 mg | Freq: Once | INTRAMUSCULAR | Status: AC
  Administered 2013-10-27: 4 mg via INTRAVENOUS
  Filled 2013-10-27: qty 1

## 2013-10-27 NOTE — ED Provider Notes (Signed)
CSN: [id number]     Arrival date & time 10/27/13  [id number] History  This chart was scribed for Rolan BuccoMelanie Belfi, MD by Nicholos Johnsenise Iheanachor, ED scribe. This patient was seen in room MH10/MH10 and the patient's care was started at 8:07 PM.    Chief Complaint  Patient presents with  . Urinary Frequency  . Dysuria    The history is provided by the patient. No language interpreter was used.   HPI Comments: Hillard DankerChristina M Lippold is a 38 y.o. female w/ hx of kidney infection presents to the Emergency Department with urinary frequency, dysuria, suprapubic abdominal pain and intermittent throbbing right lower back pain; onset 2-3 days ago. Reports some subjective fever and chills. Denies nausea, vomiting, vaginal discharge, or vaginal bleeding.  Past Medical History  Diagnosis Date  . Panic attacks   . Anxiety   . Chronic back pain   . Bipolar 1 disorder   . PTSD (post-traumatic stress disorder)    Past Surgical History  Procedure Laterality Date  . Cesarean section    . Abdominal hysterectomy    . Cholecystectomy     History reviewed. No pertinent family history. History  Substance Use Topics  . Smoking status: Current Some Day Smoker -- 0.50 packs/day    Types: Cigarettes  . Smokeless tobacco: Not on file  . Alcohol Use: No   OB History   Grav Para Term Preterm Abortions TAB SAB Ect Mult Living                 Review of Systems  Constitutional: Positive for fever and chills. Negative for diaphoresis and fatigue.  HENT: Negative for congestion, rhinorrhea and sneezing.   Eyes: Negative.   Respiratory: Negative for cough, chest tightness and shortness of breath.   Cardiovascular: Negative for chest pain and leg swelling.  Gastrointestinal: Positive for abdominal pain. Negative for diarrhea and blood in stool.  Genitourinary: Positive for dysuria and frequency. Negative for hematuria, vaginal bleeding, vaginal discharge and difficulty urinating.  Musculoskeletal: Positive for back pain.  Negative for arthralgias.  Skin: Negative for rash.  Neurological: Negative for dizziness, speech difficulty, weakness, numbness and headaches.    Allergies  Review of patient's allergies indicates no known allergies.  Home Medications   Prior to Admission medications   Medication Sig Start Date End Date Taking? Authorizing Dainel Arcidiacono  ciprofloxacin (CIPRO) 500 MG tablet Take 1 tablet (500 mg total) by mouth every 12 (twelve) hours. 04/15/13   Hope Orlene OchM Neese, NP  ciprofloxacin (CIPRO) 500 MG tablet Take 1 tablet (500 mg total) by mouth 2 (two) times daily. 10/27/13   Rolan BuccoMelanie Belfi, MD  citalopram (CELEXA) 40 MG tablet Take 40 mg by mouth daily.    Historical Jacoya Bauman, MD  clonazePAM (KLONOPIN) 1 MG tablet Take 1 mg by mouth 2 (two) times daily as needed for anxiety.    Historical Blannie Shedlock, MD  FLUoxetine (PROZAC) 20 MG tablet Take 20 mg by mouth daily.    Historical Thalia Turkington, MD  gabapentin (NEURONTIN) 300 MG capsule Take 300 mg by mouth 3 (three) times daily.    Historical Denzal Meir, MD  HYDROcodone-acetaminophen (NORCO/VICODIN) 5-325 MG per tablet Take 1 tablet by mouth every 6 (six) hours as needed for pain. 03/06/13   Gerhard Munchobert Lockwood, MD  hydrocortisone-pramoxine ([facility]FOAM [facility]) rectal foam Place 1 applicator rectally 2 (two) times daily. 05/12/12   Teressa LowerVrinda Pickering, NP  ibuprofen (ADVIL,MOTRIN) 400 MG tablet Take 1 tablet (400 mg total) by mouth every 6 (six) hours as needed for pain.  08/27/12   April K Palumbo-Rasch, MD  ondansetron (ZOFRAN ODT) 4 MG disintegrating tablet Take 1 tablet (4 mg total) by mouth every 6 (six) hours as needed for nausea. 09/05/12   Lisette Paz, PA-C  oxyCODONE-acetaminophen (PERCOCET) 5-325 MG per tablet Take 1 tablet by mouth every 6 (six) hours as needed for pain. 10/25/12   April Smitty Cords, MD  oxyCODONE-acetaminophen (PERCOCET/ROXICET) 5-325 MG per tablet Take 1 tablet by mouth every 4 (four) hours as needed for pain. 10/17/12   Garlon Hatchet, PA-C  PERCOCET  5-325 MG per tablet Take 1 tablet by mouth every 6 (six) hours as needed for pain. 09/05/12   Lisette Paz, PA-C  phenazopyridine (PYRIDIUM) 200 MG tablet Take 1 tablet (200 mg total) by mouth 3 (three) times daily. 04/15/13   Hope Orlene Och, NP  phenazopyridine (PYRIDIUM) 200 MG tablet Take 1 tablet (200 mg total) by mouth 3 (three) times daily. 10/27/13   Rolan Bucco, MD  prazosin (MINIPRESS) 1 MG capsule Take 1 mg by mouth at bedtime.    Historical Eros Montour, MD  traMADol (ULTRAM) 50 MG tablet Take 1 tablet (50 mg total) by mouth every 6 (six) hours as needed. 10/27/13   Rolan Bucco, MD  traZODone (DESYREL) 100 MG tablet Take 100 mg by mouth at bedtime.    Historical Mell Guia, MD  UNKNOWN TO PATIENT "mood stabilizer"-unsure of name due to is new med    Historical Sharone Picchi, MD   Triage vitals: BP 128/63  Pulse 68  Temp(Src) 98.9 F (37.2 C) (Oral)  Resp 18  Ht 5\' 6"  (1.676 m)  Wt 115 lb (52.164 kg)  BMI 18.57 kg/m2  SpO2 98%  Physical Exam  Nursing note and vitals reviewed. Constitutional: She is oriented to person, place, and time. She appears well-developed and well-nourished.  HENT:  Head: Normocephalic and atraumatic.  Eyes: Pupils are equal, round, and reactive to light.  Neck: Normal range of motion. Neck supple.  Cardiovascular: Normal rate, regular rhythm and normal heart sounds.   Pulmonary/Chest: Effort normal and breath sounds normal. No respiratory distress. She has no wheezes. She has no rales. She exhibits no tenderness.  Abdominal: Soft. Bowel sounds are normal. There is tenderness. There is no rebound, no guarding and no CVA tenderness.  Moderate suprapubic tenderness. No CVA tenderness.  Musculoskeletal: Normal range of motion. She exhibits no edema.  Lymphadenopathy:    She has no cervical adenopathy.  Neurological: She is alert and oriented to person, place, and time.  Skin: Skin is warm and dry. No rash noted.  Psychiatric: She has a normal mood and affect.     ED Course  Procedures (including critical care time) DIAGNOSTIC STUDIES: Oxygen Saturation is 98% on room air, normal by my interpretation.    COORDINATION OF CARE: At 8:08 PM: Discussed treatment plan with patient which includes antibiotics for a kidney infection. Patient agrees.    Labs Review Results for orders placed during the hospital encounter of 10/27/13  URINALYSIS, ROUTINE W REFLEX MICROSCOPIC      Result Value Ref Range   Color, Urine RED (*) YELLOW   APPearance TURBID (*) CLEAR   Specific Gravity, Urine 1.028  1.005 - 1.030   pH 5.0  5.0 - 8.0   Glucose, UA NEGATIVE  NEGATIVE mg/dL   Hgb urine dipstick NEGATIVE  NEGATIVE   Bilirubin Urine LARGE (*) NEGATIVE   Ketones, ur 40 (*) NEGATIVE mg/dL   Protein, ur 161 (*) NEGATIVE mg/dL   Urobilinogen, UA >0.9 (*)  0.0 - 1.0 mg/dL   Nitrite POSITIVE (*) NEGATIVE   Leukocytes, UA MODERATE (*) NEGATIVE  URINE MICROSCOPIC-ADD ON      Result Value Ref Range   Squamous Epithelial / LPF FEW (*) RARE   WBC, UA 7-10  <3 WBC/hpf   Bacteria, UA FEW (*) RARE   Urine-Other AMORPHOUS URATES/PHOSPHATES     No results found.  Imaging Review No results found.   EKG Interpretation None      MDM   Final diagnoses:  Pyelonephritis   Patient with symptoms consistent pyelonephritis. She is well-appearing and is afebrile in ED. She has no ongoing vomiting. Her blood pressure is stable. She was given a dose of Rocephin as well some IV fluids. She was discharged home with prescription for Cipro, Pyridium and Ultram for pain. She was given referral to followup with [facility] a she's had recurrent episodes of pyelonephritis. She was advised to return here if she has any worsening symptoms.  I personally performed the services described in this documentation, which was scribed in my presence. The recorded information has been reviewed and is accurate.     Rolan BuccoMelanie Belfi, MD 10/27/13 2103

## 2013-10-27 NOTE — ED Notes (Signed)
Pt reports + urinary frequency, pain with urination and bilateral lower back pain

## 2013-10-27 NOTE — Discharge Instructions (Signed)
Pyelonephritis, Adult °Pyelonephritis is a kidney infection. In general, there are 2 main types of pyelonephritis: °· Infections that come on quickly without any warning (acute pyelonephritis). °· Infections that persist for a long period of time (chronic pyelonephritis). °CAUSES  °Two main causes of pyelonephritis are: °· Bacteria traveling from the bladder to the kidney. This is a problem especially in pregnant women. The urine in the bladder can become filled with bacteria from multiple causes, including: °¨ Inflammation of the prostate gland (prostatitis). °¨ Sexual intercourse in females. °¨ Bladder infection (cystitis). °· Bacteria traveling from the bloodstream to the tissue part of the kidney. °Problems that may increase your risk of getting a kidney infection include: °· Diabetes. °· Kidney stones or bladder stones. °· Cancer. °· Catheters placed in the bladder. °· Other abnormalities of the kidney or ureter. °SYMPTOMS  °· Abdominal pain. °· Pain in the side or flank area. °· Fever. °· Chills. °· Upset stomach. °· Blood in the urine (dark urine). °· Frequent urination. °· Strong or persistent urge to urinate. °· Burning or stinging when urinating. °DIAGNOSIS  °Your caregiver may diagnose your kidney infection based on your symptoms. A urine sample may also be taken. °TREATMENT  °In general, treatment depends on how severe the infection is.  °· If the infection is mild and caught early, your caregiver may treat you with oral antibiotics and send you home. °· If the infection is more severe, the bacteria may have gotten into the bloodstream. This will require intravenous (IV) antibiotics and a hospital stay. Symptoms may include: °¨ High fever. °¨ Severe flank pain. °¨ Shaking chills. °· Even after a hospital stay, your caregiver may require you to be on oral antibiotics for a period of time. °· Other treatments may be required depending upon the cause of the infection. °HOME CARE INSTRUCTIONS  °· Take your  antibiotics as directed. Finish them even if you start to feel better. °· Make an appointment to have your urine checked to make sure the infection is gone. °· Drink enough fluids to keep your urine clear or pale yellow. °· Take medicines for the bladder if you have urgency and frequency of urination as directed by your caregiver. °SEEK IMMEDIATE MEDICAL CARE IF:  °· You have a fever or persistent symptoms for more than 2-3 days. °· You have a fever and your symptoms suddenly get worse. °· You are unable to take your antibiotics or fluids. °· You develop shaking chills. °· You experience extreme weakness or fainting. °· There is no improvement after 2 days of treatment. °MAKE SURE YOU: °· Understand these instructions. °· Will watch your condition. °· Will get help right away if you are not doing well or get worse. °Document Released: 04/20/2005 Document Revised: 10/20/2011 Document Reviewed: 09/24/2010 °ExitCare® Patient Information ©2015 [employer]. This information is not intended to replace advice given to you by your health care provider. Make sure you discuss any questions you have with your health care provider. ° °

## 2013-10-29 LAB — URINE CULTURE
Colony Count: NO GROWTH
Culture: NO GROWTH

## 2014-06-15 ENCOUNTER — Encounter (HOSPITAL_BASED_OUTPATIENT_CLINIC_OR_DEPARTMENT_OTHER): Payer: Self-pay | Admitting: Emergency Medicine

## 2014-06-15 ENCOUNTER — Emergency Department (HOSPITAL_BASED_OUTPATIENT_CLINIC_OR_DEPARTMENT_OTHER): Payer: Self-pay

## 2014-06-15 ENCOUNTER — Emergency Department (HOSPITAL_BASED_OUTPATIENT_CLINIC_OR_DEPARTMENT_OTHER)
Admission: EM | Admit: 2014-06-15 | Discharge: 2014-06-16 | Disposition: A | Discharge status: Home / Self Care | Payer: Self-pay | Attending: Emergency Medicine | Admitting: Emergency Medicine

## 2014-06-15 DIAGNOSIS — Z792 Long term (current) use of antibiotics: Secondary | ICD-10-CM | POA: Insufficient documentation

## 2014-06-15 DIAGNOSIS — Z720 Tobacco use: Secondary | ICD-10-CM | POA: Insufficient documentation

## 2014-06-15 DIAGNOSIS — G8929 Other chronic pain: Secondary | ICD-10-CM | POA: Insufficient documentation

## 2014-06-15 DIAGNOSIS — F4310 Post-traumatic stress disorder, unspecified: Secondary | ICD-10-CM | POA: Insufficient documentation

## 2014-06-15 DIAGNOSIS — F410 Panic disorder [episodic paroxysmal anxiety] without agoraphobia: Secondary | ICD-10-CM | POA: Insufficient documentation

## 2014-06-15 DIAGNOSIS — F319 Bipolar disorder, unspecified: Secondary | ICD-10-CM | POA: Insufficient documentation

## 2014-06-15 DIAGNOSIS — Z79899 Other long term (current) drug therapy: Secondary | ICD-10-CM | POA: Insufficient documentation

## 2014-06-15 DIAGNOSIS — R51 Headache: Secondary | ICD-10-CM | POA: Insufficient documentation

## 2014-06-15 DIAGNOSIS — R519 Headache, unspecified: Secondary | ICD-10-CM

## 2014-06-15 MED ORDER — KETOROLAC TROMETHAMINE 30 MG/ML IJ SOLN
30.0000 mg | Freq: Once | INTRAMUSCULAR | Status: AC
  Administered 2014-06-15: 30 mg via INTRAVENOUS
  Filled 2014-06-15: qty 1

## 2014-06-15 MED ORDER — DIPHENHYDRAMINE HCL 50 MG/ML IJ SOLN
12.5000 mg | Freq: Once | INTRAMUSCULAR | Status: AC
  Administered 2014-06-15: 12.5 mg via INTRAVENOUS
  Filled 2014-06-15: qty 1

## 2014-06-15 MED ORDER — PROMETHAZINE HCL 25 MG/ML IJ SOLN
12.5000 mg | Freq: Once | INTRAMUSCULAR | Status: AC
  Administered 2014-06-16: 12.5 mg via INTRAVENOUS
  Filled 2014-06-15: qty 1

## 2014-06-15 MED ORDER — METOCLOPRAMIDE HCL 5 MG/ML IJ SOLN
10.0000 mg | Freq: Once | INTRAMUSCULAR | Status: AC
  Administered 2014-06-15: 10 mg via INTRAVENOUS
  Filled 2014-06-15: qty 2

## 2014-06-15 NOTE — ED Notes (Signed)
Dr. Palumbo in to see pt.  

## 2014-06-15 NOTE — ED Notes (Signed)
Patient reports that she has had a HA every day for the last 2 months. Is being worked up at her PMD's for thyroid issues.

## 2014-06-15 NOTE — ED Notes (Signed)
Pt to CT

## 2014-06-15 NOTE — ED Provider Notes (Signed)
CSN: [id number]     Arrival date & time 06/15/14  2227 History  This chart was scribed for Beth Best Smitty Cords, MD by Annye Asa, ED Scribe. This patient was seen in room MH09/MH09 and the patient's care was started at 11:13 PM.    Chief Complaint  Patient presents with  . Headache   Patient is a 39 y.o. female presenting with headaches. The history is provided by the patient. No language interpreter was used.  Headache Pain location:  Occipital Quality:  Unable to specify Radiates to:  Does not radiate Severity currently:  10/10 Severity at highest:  10/10 Onset quality:  Gradual Duration:  8 weeks Timing:  Constant Progression:  Worsening Chronicity:  Recurrent Context: not activity and not exposure to bright light   Relieved by:  Nothing Worsened by:  Nothing Ineffective treatments: ibuprofen, Goody powders. Associated symptoms: no abdominal pain, no back pain, no congestion, no cough, no facial pain, no fever, no focal weakness, no neck pain, no neck stiffness, no numbness, no paresthesias, no sore throat, no swollen glands, no syncope, no tingling and no URI      HPI Comments: Beth Best is a 39 y.o. female who presents to the Emergency Department complaining of 2 months of gradually worsening headache. She localizes her headache in the "back of her head"; she has attempted to treat her pain with Tylenol and Goody powders. She does not associate her headaches with any particular circumstance; she denies association with her prior menstrual cycles (no menstrual cycles at this time). She denies itchy, watery eyes.  Patient reports that she visited her PCP [facility] several days ago and was told that her symptoms could be due to hyperthyroidism or cancer (her blood work came back "bad" and "needed to be re-run").   She regularly takes psych medications listed below; she denies recent medication changes.   Past Medical History  Diagnosis Date  . Panic  attacks   . Anxiety   . Chronic back pain   . Bipolar 1 disorder   . PTSD (post-traumatic stress disorder)    Past Surgical History  Procedure Laterality Date  . Cesarean section    . Abdominal hysterectomy    . Cholecystectomy     History reviewed. No pertinent family history. History  Substance Use Topics  . Smoking status: Current Some Day Smoker -- 0.50 packs/day    Types: Cigarettes  . Smokeless tobacco: Not on file  . Alcohol Use: No   OB History    No data available     Review of Systems  Constitutional: Negative for fever.  HENT: Positive for rhinorrhea and sneezing. Negative for congestion and sore throat.   Eyes: Negative for itching.  Respiratory: Negative for cough.   Cardiovascular: Negative for syncope.  Gastrointestinal: Negative for abdominal pain.  Musculoskeletal: Negative for back pain, neck pain and neck stiffness.  Neurological: Positive for headaches. Negative for focal weakness, numbness and paresthesias.  All other systems reviewed and are negative.  Allergies  Review of patient's allergies indicates no known allergies.  Home Medications   Prior to Admission medications   Medication Sig Start Date End Date Taking? Authorizing Provider  ciprofloxacin (CIPRO) 500 MG tablet Take 1 tablet (500 mg total) by mouth every 12 (twelve) hours. 04/15/13   Hope Orlene Och, NP  ciprofloxacin (CIPRO) 500 MG tablet Take 1 tablet (500 mg total) by mouth 2 (two) times daily. 10/27/13   Rolan Bucco, MD  citalopram (CELEXA) 40 MG tablet  Take 40 mg by mouth daily.    Historical Provider, MD  clonazePAM (KLONOPIN) 1 MG tablet Take 1 mg by mouth 2 (two) times daily as needed for anxiety.    Historical Provider, MD  FLUoxetine (PROZAC) 20 MG tablet Take 20 mg by mouth daily.    Historical Provider, MD  gabapentin (NEURONTIN) 300 MG capsule Take 300 mg by mouth 3 (three) times daily.    Historical Provider, MD  HYDROcodone-acetaminophen (NORCO/VICODIN) 5-325 MG per tablet  Take 1 tablet by mouth every 6 (six) hours as needed for pain. 03/06/13   Gerhard Munchobert Lockwood, MD  hydrocortisone-pramoxine ([facility]CTOFOAM [facility]) rectal foam Place 1 applicator rectally 2 (two) times daily. 05/12/12   Teressa LowerVrinda Pickering, NP  ibuprofen (ADVIL,MOTRIN) 400 MG tablet Take 1 tablet (400 mg total) by mouth every 6 (six) hours as needed for pain. 08/27/12   Harmonie Verrastro K Tanith Dagostino-Rasch, MD  ondansetron (ZOFRAN ODT) 4 MG disintegrating tablet Take 1 tablet (4 mg total) by mouth every 6 (six) hours as needed for nausea. 09/05/12   Lisette Paz, PA-C  oxyCODONE-acetaminophen (PERCOCET) 5-325 MG per tablet Take 1 tablet by mouth every 6 (six) hours as needed for pain. 10/25/12   Ahnya Akre Smitty CordsK Justice Aguirre-Rasch, MD  oxyCODONE-acetaminophen (PERCOCET/ROXICET) 5-325 MG per tablet Take 1 tablet by mouth every 4 (four) hours as needed for pain. 10/17/12   Garlon HatchetLisa M Sanders, PA-C  PERCOCET 5-325 MG per tablet Take 1 tablet by mouth every 6 (six) hours as needed for pain. 09/05/12   Lisette Paz, PA-C  phenazopyridine (PYRIDIUM) 200 MG tablet Take 1 tablet (200 mg total) by mouth 3 (three) times daily. 04/15/13   Hope Orlene OchM Neese, NP  phenazopyridine (PYRIDIUM) 200 MG tablet Take 1 tablet (200 mg total) by mouth 3 (three) times daily. 10/27/13   Rolan BuccoMelanie Belfi, MD  prazosin (MINIPRESS) 1 MG capsule Take 1 mg by mouth at bedtime.    Historical Provider, MD  traMADol (ULTRAM) 50 MG tablet Take 1 tablet (50 mg total) by mouth every 6 (six) hours as needed. 10/27/13   Rolan BuccoMelanie Belfi, MD  traZODone (DESYREL) 100 MG tablet Take 100 mg by mouth at bedtime.    Historical Provider, MD  UNKNOWN TO PATIENT "mood stabilizer"-unsure of name due to is new med    Historical Provider, MD   BP 149/95 mmHg  Pulse 74  Temp(Src) 98 F (36.7 C) (Oral)  Resp 18  Ht 5\' 6"  (1.676 m)  Wt 122 lb (55.339 kg)  BMI 19.70 kg/m2  SpO2 97% Physical Exam  Constitutional: She is oriented to person, place, and time. She appears well-developed and well-nourished. No distress.   HENT:  Head: Normocephalic and atraumatic.  Mouth/Throat: Oropharynx is clear and moist. No oropharyngeal exudate.  Moist mucous membranes. Trachea is midline, no meningismus.   Eyes: EOM are normal. Pupils are equal, round, and reactive to light.  Neck: Normal range of motion. Neck supple.  No lymph nodes in the neck, supraclavicular fossa, axillary, groin B, nor popliteal fossa B .  Cardiovascular: Normal rate, regular rhythm, normal heart sounds and intact distal pulses.  Exam reveals no gallop and no friction rub.   No murmur heard. Pulmonary/Chest: Effort normal. No stridor. No respiratory distress. She has no wheezes. She has no rales.  Abdominal: Soft. Bowel sounds are normal. There is no tenderness. There is no rebound and no guarding.  Musculoskeletal: Normal range of motion. She exhibits no edema.  Lymphadenopathy:    She has no cervical adenopathy.  Neurological: She is  alert and oriented to person, place, and time. She has normal reflexes. She displays normal reflexes. No cranial nerve deficit. She exhibits normal muscle tone. Coordination normal.  Cranial nerves 2-12 intact. DTRs normal; extremity strength 5/5.   Skin: Skin is warm and dry. No rash noted. No erythema. No pallor.  Psychiatric: She has a normal mood and affect. Her behavior is normal.  Nursing note and vitals reviewed.   ED Course  Procedures   DIAGNOSTIC STUDIES: Oxygen Saturation is 97% on RA, adequate by my interpretation.    COORDINATION OF CARE: 11:20 PM Discussed treatment plan with pt at bedside and pt agreed to plan.  11:23 PM Reviewed medical records and results under Care Everywhere and her CBC has no remarkable results. This was done at [facility] on [facility] - per records, patient was there to establish a new patient care relationship and did not mention any headache at that time. Her only concerns were PTSD and agoraphobia. She was recently discharged from a psych hospital with  history of opioid dependency and was discharged for same in 2015. This has been an ongoing issue since before July 2015, as documented by [facility].   Labs Review Labs Reviewed - No data to display  Imaging Review No results found.   EKG Interpretation None      MDM   Final diagnoses:  None   MDM Reviewed: previous chart, nursing note and vitals (care everywhere cbc was [facility] at [facility] 2 days ago.  Per care everywhere records has been a [facility] and [facility] for same headaches multiple times in the past.  Moreover was just seen and failed to mention this headache) Reviewed previous: labs (via care everywhere) Interpretation: CT scan   Given the chronic nature of this headache and previous headaches seen at both [facility] and [facility] there is no indication for LP.  Without fever rashes or stiff neck infection is highly unlikely especialliy given time course.  Also intact cognition and EOMI give time course makes cavernous sinus thrombosis extremely unlikely.  Will refer to neurology for ongoing care and management of chronic headaches.      I personally performed the services described in this documentation, which was scribed in my presence. The recorded information has been reviewed and is accurate.       Jasmine Awe, MD 06/16/14 [phone]

## 2014-06-16 ENCOUNTER — Encounter (HOSPITAL_BASED_OUTPATIENT_CLINIC_OR_DEPARTMENT_OTHER): Payer: Self-pay | Admitting: Emergency Medicine

## 2014-06-16 NOTE — ED Notes (Signed)
Dr. Nicanor AlconPalumbo in to room to speak with pt/family with RN for team d/c. Denies questions or needs at this time.

## 2014-06-16 NOTE — ED Notes (Signed)
Pt up to br

## 2014-06-18 ENCOUNTER — Emergency Department (HOSPITAL_BASED_OUTPATIENT_CLINIC_OR_DEPARTMENT_OTHER)
Admission: EM | Admit: 2014-06-18 | Discharge: 2014-06-18 | Disposition: A | Discharge status: Home / Self Care | Payer: Self-pay | Attending: Emergency Medicine | Admitting: Emergency Medicine

## 2014-06-18 ENCOUNTER — Encounter (HOSPITAL_BASED_OUTPATIENT_CLINIC_OR_DEPARTMENT_OTHER): Payer: Self-pay | Admitting: *Deleted

## 2014-06-18 DIAGNOSIS — F319 Bipolar disorder, unspecified: Secondary | ICD-10-CM | POA: Insufficient documentation

## 2014-06-18 DIAGNOSIS — Z720 Tobacco use: Secondary | ICD-10-CM | POA: Insufficient documentation

## 2014-06-18 DIAGNOSIS — Z792 Long term (current) use of antibiotics: Secondary | ICD-10-CM | POA: Insufficient documentation

## 2014-06-18 DIAGNOSIS — F4310 Post-traumatic stress disorder, unspecified: Secondary | ICD-10-CM | POA: Insufficient documentation

## 2014-06-18 DIAGNOSIS — Z79899 Other long term (current) drug therapy: Secondary | ICD-10-CM | POA: Insufficient documentation

## 2014-06-18 DIAGNOSIS — G43909 Migraine, unspecified, not intractable, without status migrainosus: Secondary | ICD-10-CM | POA: Insufficient documentation

## 2014-06-18 DIAGNOSIS — Z7952 Long term (current) use of systemic steroids: Secondary | ICD-10-CM | POA: Insufficient documentation

## 2014-06-18 DIAGNOSIS — G8929 Other chronic pain: Secondary | ICD-10-CM | POA: Insufficient documentation

## 2014-06-18 DIAGNOSIS — F410 Panic disorder [episodic paroxysmal anxiety] without agoraphobia: Secondary | ICD-10-CM | POA: Insufficient documentation

## 2014-06-18 MED ORDER — DIPHENHYDRAMINE HCL 50 MG/ML IJ SOLN
25.0000 mg | Freq: Once | INTRAMUSCULAR | Status: AC
  Administered 2014-06-18: 25 mg via INTRAVENOUS
  Filled 2014-06-18: qty 1

## 2014-06-18 MED ORDER — SODIUM CHLORIDE 0.9 % IV BOLUS (SEPSIS)
1000.0000 mL | Freq: Once | INTRAVENOUS | Status: AC
  Administered 2014-06-18: 1000 mL via INTRAVENOUS

## 2014-06-18 MED ORDER — DEXAMETHASONE SODIUM PHOSPHATE 10 MG/ML IJ SOLN
10.0000 mg | Freq: Once | INTRAMUSCULAR | Status: AC
  Administered 2014-06-18: 10 mg via INTRAVENOUS
  Filled 2014-06-18: qty 1

## 2014-06-18 MED ORDER — TRAMADOL HCL 50 MG PO TABS: 50.0000 mg | ORAL_TABLET | Freq: Four times a day (QID) | ORAL | Status: DC | PRN

## 2014-06-18 MED ORDER — KETOROLAC TROMETHAMINE 30 MG/ML IJ SOLN
30.0000 mg | Freq: Once | INTRAMUSCULAR | Status: AC
  Administered 2014-06-18: 30 mg via INTRAVENOUS
  Filled 2014-06-18: qty 1

## 2014-06-18 MED ORDER — METOCLOPRAMIDE HCL 5 MG/ML IJ SOLN
10.0000 mg | Freq: Once | INTRAMUSCULAR | Status: AC
  Administered 2014-06-18: 10 mg via INTRAVENOUS
  Filled 2014-06-18: qty 2

## 2014-06-18 NOTE — ED Notes (Signed)
Headache. She is being tested for hyperthyroidism that may be causing the headaches.

## 2014-06-18 NOTE — Discharge Instructions (Signed)
Tramadol as prescribed as needed for pain.  Follow-up with your primary doctor to discuss with frequency of your headaches and how to prevent them.   Migraine Headache A migraine headache is an intense, throbbing pain on one or both sides of your head. A migraine can last for 30 minutes to several hours. CAUSES  The exact cause of a migraine headache is not always known. However, a migraine may be caused when nerves in the brain become irritated and release chemicals that cause inflammation. This causes pain. Certain things may also trigger migraines, such as:  Alcohol.  Smoking.  Stress.  Menstruation.  Aged cheeses.  Foods or drinks that contain nitrates, glutamate, aspartame, or tyramine.  Lack of sleep.  Chocolate.  Caffeine.  Hunger.  Physical exertion.  Fatigue.  Medicines used to treat chest pain (nitroglycerine), birth control pills, estrogen, and some blood pressure medicines. SIGNS AND SYMPTOMS  Pain on one or both sides of your head.  Pulsating or throbbing pain.  Severe pain that prevents daily activities.  Pain that is aggravated by any physical activity.  Nausea, vomiting, or both.  Dizziness.  Pain with exposure to bright lights, loud noises, or activity.  General sensitivity to bright lights, loud noises, or smells. Before you get a migraine, you may get warning signs that a migraine is coming (aura). An aura may include:  Seeing flashing lights.  Seeing bright spots, halos, or zigzag lines.  Having tunnel vision or blurred vision.  Having feelings of numbness or tingling.  Having trouble talking.  Having muscle weakness. DIAGNOSIS  A migraine headache is often diagnosed based on:  Symptoms.  Physical exam.  A CT scan or MRI of your head. These imaging tests cannot diagnose migraines, but they can help rule out other causes of headaches. TREATMENT Medicines may be given for pain and nausea. Medicines can also be given to help  prevent recurrent migraines.  HOME CARE INSTRUCTIONS  Only take over-the-counter or prescription medicines for pain or discomfort as directed by your health care provider. The use of long-term narcotics is not recommended.  Lie down in a dark, quiet room when you have a migraine.  Keep a journal to find out what may trigger your migraine headaches. For example, write down:  What you eat and drink.  How much sleep you get.  Any change to your diet or medicines.  Limit alcohol consumption.  Quit smoking if you smoke.  Get 7-9 hours of sleep, or as recommended by your health care provider.  Limit stress.  Keep lights dim if bright lights bother you and make your migraines worse. SEEK IMMEDIATE MEDICAL CARE IF:   Your migraine becomes severe.  You have a fever.  You have a stiff neck.  You have vision loss.  You have muscular weakness or loss of muscle control.  You start losing your balance or have trouble walking.  You feel faint or pass out.  You have severe symptoms that are different from your first symptoms. MAKE SURE YOU:   Understand these instructions.  Will watch your condition.  Will get help right away if you are not doing well or get worse. Document Released: 04/20/2005 Document Revised: 09/04/2013 Document Reviewed: 12/26/2012 [facility] Patient Information 2015 [place], [place]. This information is not intended to replace advice given to you by your health care provider. Make sure you discuss any questions you have with your health care provider.

## 2014-06-18 NOTE — ED Provider Notes (Signed)
CSN: [id number]     Arrival date & time 06/18/14  1845 History   This chart was scribed for Geoffery Lyons, MD by Abel Presto, ED Scribe. This patient was seen in room MH09/MH09 and the patient's care was started at 7:10 PM.    Chief Complaint  Patient presents with  . Headache     Patient is a 39 y.o. female presenting with headaches. The history is provided by the patient. No language interpreter was used.  Headache Associated symptoms: nausea   Associated symptoms: no fever and no vomiting    HPI Comments: Beth Best is a 39 y.o. female with PMHx of anxiety, chronic back pain, bipolar disorder, and PTSD who presents to the Emergency Department complaining of intermittent headaches for several months. Pt was seen by PCP recently, with blood work done, noting she was told problem may be hyperthyroidism. Pt was seen in ED 3 days ago for similar pain.  A head CT scan was done with no significant findings. She notes she was given a headache cocktail with mild relief. Pt notes associated nausea and blurred vision. Pt notably upset and teary-eyed in exam room. Pt has NKDA. Pt denies vomiting, trauma or injury, and fever.  Past Medical History  Diagnosis Date  . Panic attacks   . Anxiety   . Chronic back pain   . Bipolar 1 disorder   . PTSD (post-traumatic stress disorder)    Past Surgical History  Procedure Laterality Date  . Cesarean section    . Abdominal hysterectomy    . Cholecystectomy     No family history on file. History  Substance Use Topics  . Smoking status: Current Some Day Smoker -- 0.50 packs/day    Types: Cigarettes  . Smokeless tobacco: Not on file  . Alcohol Use: No   OB History    No data available     Review of Systems  Constitutional: Negative for fever.  Eyes: Positive for visual disturbance.  Gastrointestinal: Positive for nausea. Negative for vomiting.  Neurological: Positive for headaches.  All other systems reviewed and are  negative.     Allergies  Review of patient's allergies indicates no known allergies.  Home Medications   Prior to Admission medications   Medication Sig Start Date End Date Taking? Authorizing Provider  Diazepam (VALIUM PO) Take by mouth.   Yes Historical Provider, MD  QUEtiapine Fumarate (SEROQUEL PO) Take by mouth.   Yes Historical Provider, MD  ciprofloxacin (CIPRO) 500 MG tablet Take 1 tablet (500 mg total) by mouth every 12 (twelve) hours. 04/15/13   Hope Orlene Och, NP  ciprofloxacin (CIPRO) 500 MG tablet Take 1 tablet (500 mg total) by mouth 2 (two) times daily. 10/27/13   Rolan Bucco, MD  citalopram (CELEXA) 40 MG tablet Take 40 mg by mouth daily.    Historical Provider, MD  clonazePAM (KLONOPIN) 1 MG tablet Take 1 mg by mouth 2 (two) times daily as needed for anxiety.    Historical Provider, MD  FLUoxetine (PROZAC) 20 MG tablet Take 20 mg by mouth daily.    Historical Provider, MD  gabapentin (NEURONTIN) 300 MG capsule Take 300 mg by mouth 3 (three) times daily.    Historical Provider, MD  HYDROcodone-acetaminophen (NORCO/VICODIN) 5-325 MG per tablet Take 1 tablet by mouth every 6 (six) hours as needed for pain. 03/06/13   Gerhard Munch, MD  hydrocortisone-pramoxine (PROCTOFOAM [facility]) rectal foam Place 1 applicator rectally 2 (two) times daily. 05/12/12   Teressa Lower, NP  ibuprofen (ADVIL,MOTRIN) 400 MG tablet Take 1 tablet (400 mg total) by mouth every 6 (six) hours as needed for pain. 08/27/12   April K Palumbo-Rasch, MD  ondansetron (ZOFRAN ODT) 4 MG disintegrating tablet Take 1 tablet (4 mg total) by mouth every 6 (six) hours as needed for nausea. 09/05/12   Lisette Paz, PA-C  oxyCODONE-acetaminophen (PERCOCET) 5-325 MG per tablet Take 1 tablet by mouth every 6 (six) hours as needed for pain. 10/25/12   April Smitty Cords, MD  oxyCODONE-acetaminophen (PERCOCET/ROXICET) 5-325 MG per tablet Take 1 tablet by mouth every 4 (four) hours as needed for pain. 10/17/12   Garlon Hatchet,  PA-C  PERCOCET 5-325 MG per tablet Take 1 tablet by mouth every 6 (six) hours as needed for pain. 09/05/12   Lisette Paz, PA-C  phenazopyridine (PYRIDIUM) 200 MG tablet Take 1 tablet (200 mg total) by mouth 3 (three) times daily. 04/15/13   Hope Orlene Och, NP  phenazopyridine (PYRIDIUM) 200 MG tablet Take 1 tablet (200 mg total) by mouth 3 (three) times daily. 10/27/13   Rolan Bucco, MD  prazosin (MINIPRESS) 1 MG capsule Take 1 mg by mouth at bedtime.    Historical Provider, MD  traMADol (ULTRAM) 50 MG tablet Take 1 tablet (50 mg total) by mouth every 6 (six) hours as needed. 10/27/13   Rolan Bucco, MD  traZODone (DESYREL) 100 MG tablet Take 100 mg by mouth at bedtime.    Historical Provider, MD  UNKNOWN TO PATIENT "mood stabilizer"-unsure of name due to is new med    Historical Provider, MD   BP 146/92 mmHg  Pulse 84  Temp(Src) 98 F (36.7 C) (Oral)  Resp 18  Ht  (1.676 m)  Wt 122 lb (55.339 kg)  BMI 19.70 kg/m2  SpO2 98% Physical Exam  Constitutional: She is oriented to person, place, and time. She appears well-developed and well-nourished.  Appears anxious and tearful  HENT:  Head: Normocephalic.  Eyes: Conjunctivae and EOM are normal. Pupils are equal, round, and reactive to light.  Fundoscopic exam:      The right eye shows no papilledema.       The left eye shows no papilledema.  Neck: Normal range of motion. Neck supple.  Cardiovascular: Normal rate, regular rhythm and normal heart sounds.   No murmur heard. Pulmonary/Chest: Effort normal and breath sounds normal. No respiratory distress.  Abdominal: Soft. Bowel sounds are normal.  Musculoskeletal: Normal range of motion.  Neurological: She is alert and oriented to person, place, and time. No cranial nerve deficit. She exhibits normal muscle tone. Coordination normal.  Skin: Skin is warm and dry.  Psychiatric: She has a normal mood and affect. Her behavior is normal.  Nursing note and vitals reviewed.   ED Course   Procedures (including critical care time) DIAGNOSTIC STUDIES: Oxygen Saturation is 98% on room air, normal by my interpretation.    COORDINATION OF CARE: 7:17 PM Discussed treatment plan with patient at beside, the patient agrees with the plan and has no further questions at this time.   Labs Review Labs Reviewed - No data to display  Imaging Review No results found.   EKG Interpretation None      MDM   Final diagnoses:  None    Patient presents with complaints of headache. This is her second visit in the last 3 days for similar complaints. CT scan was -2 days ago and she remains neurologically intact. I doubt an emergent process. She was given a migraine  cocktail and IV fluids in the ER and is now feeling significantly improved. This point I feel as though she is appropriate for discharge, to return as needed for any problems. I will prescribe a small quantity of tramadol she can take if her symptoms return.   I personally performed the services described in this documentation, which was scribed in my presence. The recorded information has been reviewed and is accurate.      Geoffery Lyonsouglas Andreu Drudge, MD 06/18/14 2017

## 2014-06-20 ENCOUNTER — Encounter: Payer: Self-pay | Admitting: *Deleted

## 2014-06-20 ENCOUNTER — Telehealth: Payer: Self-pay | Admitting: *Deleted

## 2014-06-20 NOTE — Telephone Encounter (Signed)
Talked with shannon and she is scheduled with Dr. Lucia GaskinsAhern.

## 2014-06-20 NOTE — Progress Notes (Deleted)
Subjective:    Patient ID: Beth Best is a 39 y.o. female.  HPI {Common ambulatory SmartLinks:[id number]}  Review of Systems  Objective:  Neurologic Exam  Physical Exam  Assessment:   ***  Plan:   ***

## 2014-06-21 ENCOUNTER — Ambulatory Visit: Payer: Self-pay | Admitting: Neurology

## 2014-06-23 ENCOUNTER — Encounter (HOSPITAL_BASED_OUTPATIENT_CLINIC_OR_DEPARTMENT_OTHER): Payer: Self-pay | Admitting: Emergency Medicine

## 2014-06-23 ENCOUNTER — Emergency Department (HOSPITAL_BASED_OUTPATIENT_CLINIC_OR_DEPARTMENT_OTHER)
Admission: EM | Admit: 2014-06-23 | Discharge: 2014-06-24 | Disposition: A | Discharge status: Home / Self Care | Payer: Self-pay | Attending: Emergency Medicine | Admitting: Emergency Medicine

## 2014-06-23 DIAGNOSIS — G8929 Other chronic pain: Secondary | ICD-10-CM | POA: Insufficient documentation

## 2014-06-23 DIAGNOSIS — F319 Bipolar disorder, unspecified: Secondary | ICD-10-CM | POA: Insufficient documentation

## 2014-06-23 DIAGNOSIS — F4310 Post-traumatic stress disorder, unspecified: Secondary | ICD-10-CM | POA: Insufficient documentation

## 2014-06-23 DIAGNOSIS — R519 Headache, unspecified: Secondary | ICD-10-CM

## 2014-06-23 DIAGNOSIS — F410 Panic disorder [episodic paroxysmal anxiety] without agoraphobia: Secondary | ICD-10-CM | POA: Insufficient documentation

## 2014-06-23 DIAGNOSIS — Z792 Long term (current) use of antibiotics: Secondary | ICD-10-CM | POA: Insufficient documentation

## 2014-06-23 DIAGNOSIS — Z79899 Other long term (current) drug therapy: Secondary | ICD-10-CM | POA: Insufficient documentation

## 2014-06-23 DIAGNOSIS — Z720 Tobacco use: Secondary | ICD-10-CM | POA: Insufficient documentation

## 2014-06-23 DIAGNOSIS — Z3202 Encounter for pregnancy test, result negative: Secondary | ICD-10-CM | POA: Insufficient documentation

## 2014-06-23 DIAGNOSIS — R51 Headache: Secondary | ICD-10-CM | POA: Insufficient documentation

## 2014-06-23 NOTE — ED Notes (Signed)
Pt has been seen over last two week for headache has taken maloxin from PMD for two days w/o relief reports constant headache

## 2014-06-24 LAB — URINALYSIS, ROUTINE W REFLEX MICROSCOPIC
GLUCOSE, UA: NEGATIVE mg/dL
HGB URINE DIPSTICK: NEGATIVE
Ketones, ur: NEGATIVE mg/dL
Leukocytes, UA: NEGATIVE
Nitrite: NEGATIVE
PROTEIN: 30 mg/dL — AB
SPECIFIC GRAVITY, URINE: 1.028 (ref 1.005–1.030)
UROBILINOGEN UA: 0.2 mg/dL (ref 0.0–1.0)
pH: 5.5 (ref 5.0–8.0)

## 2014-06-24 LAB — PREGNANCY, URINE: Preg Test, Ur: NEGATIVE

## 2014-06-24 LAB — URINE MICROSCOPIC-ADD ON

## 2014-06-24 MED ORDER — METOCLOPRAMIDE HCL 10 MG PO TABS: 10.0000 mg | ORAL_TABLET | Freq: Four times a day (QID) | ORAL | Status: DC | PRN

## 2014-06-24 MED ORDER — KETOROLAC TROMETHAMINE 30 MG/ML IJ SOLN
30.0000 mg | Freq: Once | INTRAMUSCULAR | Status: AC
  Administered 2014-06-24: 30 mg via INTRAVENOUS
  Filled 2014-06-24: qty 1

## 2014-06-24 MED ORDER — HYDROCODONE-ACETAMINOPHEN 5-325 MG PO TABS: 1.0000 | ORAL_TABLET | Freq: Four times a day (QID) | ORAL | Status: DC | PRN

## 2014-06-24 MED ORDER — DIPHENHYDRAMINE HCL 50 MG/ML IJ SOLN
25.0000 mg | Freq: Once | INTRAMUSCULAR | Status: AC
  Administered 2014-06-24: 25 mg via INTRAVENOUS
  Filled 2014-06-24: qty 1

## 2014-06-24 MED ORDER — METOCLOPRAMIDE HCL 5 MG/ML IJ SOLN
10.0000 mg | Freq: Once | INTRAMUSCULAR | Status: AC
  Administered 2014-06-24: 10 mg via INTRAVENOUS
  Filled 2014-06-24: qty 2

## 2014-06-24 MED ORDER — SODIUM CHLORIDE 0.9 % IV BOLUS (SEPSIS)
1000.0000 mL | Freq: Once | INTRAVENOUS | Status: AC
  Administered 2014-06-24: 1000 mL via INTRAVENOUS

## 2014-06-24 MED ORDER — FENTANYL CITRATE 0.05 MG/ML IJ SOLN
100.0000 ug | Freq: Once | INTRAMUSCULAR | Status: AC
  Administered 2014-06-24: 100 ug via INTRAVENOUS
  Filled 2014-06-24: qty 2

## 2014-06-24 NOTE — ED Notes (Signed)
Pt c/o posterior headache. Requesting something further for pain. MD aware and in to see pt.

## 2014-06-24 NOTE — ED Provider Notes (Signed)
CSN: [id number]     Arrival date & time 06/23/14  2033 History  This chart was scribed for Beth SeamenJohn L Makinley Muscato, MD by Jarvis Morganaylor Ferguson, ED Scribe. This patient was seen in room MH07/MH07 and the patient's care was started at 12:36 AM.    Chief Complaint  Patient presents with  . Headache    The history is provided by the patient. No language interpreter was used.    HPI Comments: Beth Best is a 39 y.o. female with a h/o anxiety, chronic back pain, bipolar 1 disorder, and PTSD who presents to the Emergency Department complaining of constant, waxing and waning, "10/10" headache for 2 months. The pain localized in the occipital region of her head and extends into her temples. She is having associated nausea, vomiting and photophobia. She has been to the ED 3x in the past two weeks for the same. She has had blood work done by her PCP and is waiting on the results; she states he suspects hyperthyroidism. She has an appointment with a neurologist tomorrow. CT of the head done 06/15/2014 was unremarkable. Pt states she was given IV migraine cocktails in the ED previously which did not relieve her symptoms completely. She has also been taking Mobic as prescribed with no relief.   Pt has a second complaint of dysuria and believes she may be getting a bladder infection.  Past Medical History  Diagnosis Date  . Panic attacks   . Anxiety   . Chronic back pain   . Bipolar 1 disorder   . PTSD (post-traumatic stress disorder)    Past Surgical History  Procedure Laterality Date  . Cesarean section    . Abdominal hysterectomy    . Cholecystectomy    . Appendectomy     History reviewed. No pertinent family history. History  Substance Use Topics  . Smoking status: Current Some Day Smoker -- 0.50 packs/day    Types: Cigarettes  . Smokeless tobacco: Not on file  . Alcohol Use: No   OB History    No data available     Review of Systems  All other systems reviewed and are  negative.   Allergies  Review of patient's allergies indicates no known allergies.  Home Medications   Prior to Admission medications   Medication Sig Start Date End Date Taking? Authorizing Provider  ciprofloxacin (CIPRO) 500 MG tablet Take 1 tablet (500 mg total) by mouth every 12 (twelve) hours. 04/15/13   Hope Orlene OchM Neese, NP  ciprofloxacin (CIPRO) 500 MG tablet Take 1 tablet (500 mg total) by mouth 2 (two) times daily. 10/27/13   Rolan BuccoMelanie Belfi, MD  citalopram (CELEXA) 40 MG tablet Take 40 mg by mouth daily.    Historical Provider, MD  clonazePAM (KLONOPIN) 1 MG tablet Take 1 mg by mouth 2 (two) times daily as needed for anxiety.    Historical Provider, MD  Diazepam (VALIUM PO) Take by mouth.    Historical Provider, MD  FLUoxetine (PROZAC) 20 MG tablet Take 20 mg by mouth daily.    Historical Provider, MD  gabapentin (NEURONTIN) 300 MG capsule Take 300 mg by mouth 3 (three) times daily.    Historical Provider, MD  HYDROcodone-acetaminophen (NORCO/VICODIN) 5-325 MG per tablet Take 1 tablet by mouth every 6 (six) hours as needed for pain. 03/06/13   Gerhard Munchobert Lockwood, MD  hydrocortisone-pramoxine ([facility]OFOAM [facility]) rectal foam Place 1 applicator rectally 2 (two) times daily. 05/12/12   Teressa LowerVrinda Pickering, NP  ibuprofen (ADVIL,MOTRIN) 400 MG tablet Take 1 tablet (  400 mg total) by mouth every 6 (six) hours as needed for pain. 08/27/12   April K Palumbo-Rasch, MD  ondansetron (ZOFRAN ODT) 4 MG disintegrating tablet Take 1 tablet (4 mg total) by mouth every 6 (six) hours as needed for nausea. 09/05/12   Lisette Paz, PA-C  oxyCODONE-acetaminophen (PERCOCET) 5-325 MG per tablet Take 1 tablet by mouth every 6 (six) hours as needed for pain. 10/25/12   April Smitty Cords, MD  oxyCODONE-acetaminophen (PERCOCET/ROXICET) 5-325 MG per tablet Take 1 tablet by mouth every 4 (four) hours as needed for pain. 10/17/12   Garlon Hatchet, PA-C  PERCOCET 5-325 MG per tablet Take 1 tablet by mouth every 6 (six) hours as needed  for pain. 09/05/12   Lisette Paz, PA-C  phenazopyridine (PYRIDIUM) 200 MG tablet Take 1 tablet (200 mg total) by mouth 3 (three) times daily. 04/15/13   Hope Orlene Och, NP  phenazopyridine (PYRIDIUM) 200 MG tablet Take 1 tablet (200 mg total) by mouth 3 (three) times daily. 10/27/13   Rolan Bucco, MD  prazosin (MINIPRESS) 1 MG capsule Take 1 mg by mouth at bedtime.    Historical Provider, MD  QUEtiapine Fumarate (SEROQUEL PO) Take by mouth.    Historical Provider, MD  traMADol (ULTRAM) 50 MG tablet Take 1 tablet (50 mg total) by mouth every 6 (six) hours as needed. 06/18/14   Geoffery Lyons, MD  traZODone (DESYREL) 100 MG tablet Take 100 mg by mouth at bedtime.    Historical Provider, MD  UNKNOWN TO PATIENT "mood stabilizer"-unsure of name due to is new med    Historical Provider, MD   Triage Vitals: BP 140/92 mmHg  Pulse 100  Temp(Src) 98.3 F (36.8 C) (Oral)  Resp 18  SpO2 98%   Physical Exam  Nursing note and vitals reviewed. General: Well-developed, well-nourished female in no acute distress; appearance consistent with age of record HENT: normocephalic; atraumatic Eyes: pupils equal, round and reactive to light; extraocular muscles intact Neck: supple Heart: regular rate and rhythm; no murmurs, rubs or gallops Lungs: clear to auscultation bilaterally Abdomen: soft; nondistended; nontender; no masses or hepatosplenomegaly; bowel sounds present Extremities: No deformity; full range of motion; pulses normal Neurologic: Awake, alert and oriented; motor function intact in all extremities and symmetric; no facial droop Skin: Warm and dry Psychiatric: Normal mood and affect  ED Course  Procedures (including critical care time)  DIAGNOSTIC STUDIES: Oxygen Saturation is 98% on RA, normal by my interpretation.    COORDINATION OF CARE: 12:47 AM- Will order UA.  Pt advised of plan for treatment and pt agrees.    MDM   Nursing notes and vitals signs, including pulse oximetry,  reviewed.  Summary of this visit's results, reviewed by myself:  Labs:  Results for orders placed or performed during the hospital encounter of 06/23/14 (from the past 24 hour(s))  Urinalysis, Routine w reflex microscopic     Status: Abnormal   Collection Time: 06/24/14  1:05 AM  Result Value Ref Range   Color, Urine AMBER (A) YELLOW   APPearance TURBID (A) CLEAR   Specific Gravity, Urine 1.028 1.005 - 1.030   pH 5.5 5.0 - 8.0   Glucose, UA NEGATIVE NEGATIVE mg/dL   Hgb urine dipstick NEGATIVE NEGATIVE   Bilirubin Urine SMALL (A) NEGATIVE   Ketones, ur NEGATIVE NEGATIVE mg/dL   Protein, ur 30 (A) NEGATIVE mg/dL   Urobilinogen, UA 0.2 0.0 - 1.0 mg/dL   Nitrite NEGATIVE NEGATIVE   Leukocytes, UA NEGATIVE NEGATIVE  Pregnancy, urine  Status: None   Collection Time: 06/24/14  1:05 AM  Result Value Ref Range   Preg Test, Ur NEGATIVE NEGATIVE  Urine microscopic-add on     Status: Abnormal   Collection Time: 06/24/14  1:05 AM  Result Value Ref Range   Squamous Epithelial / LPF FEW (A) RARE   WBC, UA 0-2 <3 WBC/hpf   RBC / HPF 0-2 <3 RBC/hpf   Urine-Other AMORPHOUS URATES/PHOSPHATES   2:32 AM Feeling better after IV medications. She has her first appointment with a neurologist tomorrow.  I personally performed the services described in this documentation, which was scribed in my presence. The recorded information has been reviewed and is accurate.   Beth Seamen, MD 06/24/14 [phone]

## 2014-06-25 ENCOUNTER — Ambulatory Visit: Payer: Self-pay | Admitting: Neurology

## 2014-06-26 ENCOUNTER — Encounter: Payer: Self-pay | Admitting: Neurology

## 2014-07-01 ENCOUNTER — Encounter (HOSPITAL_BASED_OUTPATIENT_CLINIC_OR_DEPARTMENT_OTHER): Payer: Self-pay | Admitting: *Deleted

## 2014-07-01 ENCOUNTER — Emergency Department (HOSPITAL_BASED_OUTPATIENT_CLINIC_OR_DEPARTMENT_OTHER)
Admission: EM | Admit: 2014-07-01 | Discharge: 2014-07-01 | Disposition: A | Discharge status: Home / Self Care | Payer: Self-pay | Attending: Emergency Medicine | Admitting: Emergency Medicine

## 2014-07-01 ENCOUNTER — Emergency Department (HOSPITAL_BASED_OUTPATIENT_CLINIC_OR_DEPARTMENT_OTHER): Payer: Self-pay

## 2014-07-01 DIAGNOSIS — Z720 Tobacco use: Secondary | ICD-10-CM | POA: Insufficient documentation

## 2014-07-01 DIAGNOSIS — F319 Bipolar disorder, unspecified: Secondary | ICD-10-CM | POA: Insufficient documentation

## 2014-07-01 DIAGNOSIS — F419 Anxiety disorder, unspecified: Secondary | ICD-10-CM

## 2014-07-01 DIAGNOSIS — R002 Palpitations: Secondary | ICD-10-CM | POA: Insufficient documentation

## 2014-07-01 DIAGNOSIS — F4310 Post-traumatic stress disorder, unspecified: Secondary | ICD-10-CM | POA: Insufficient documentation

## 2014-07-01 DIAGNOSIS — R079 Chest pain, unspecified: Secondary | ICD-10-CM | POA: Insufficient documentation

## 2014-07-01 DIAGNOSIS — F410 Panic disorder [episodic paroxysmal anxiety] without agoraphobia: Secondary | ICD-10-CM | POA: Insufficient documentation

## 2014-07-01 DIAGNOSIS — Z792 Long term (current) use of antibiotics: Secondary | ICD-10-CM | POA: Insufficient documentation

## 2014-07-01 DIAGNOSIS — G8929 Other chronic pain: Secondary | ICD-10-CM | POA: Insufficient documentation

## 2014-07-01 DIAGNOSIS — Z7952 Long term (current) use of systemic steroids: Secondary | ICD-10-CM | POA: Insufficient documentation

## 2014-07-01 DIAGNOSIS — R000 Tachycardia, unspecified: Secondary | ICD-10-CM | POA: Insufficient documentation

## 2014-07-01 DIAGNOSIS — Z79899 Other long term (current) drug therapy: Secondary | ICD-10-CM | POA: Insufficient documentation

## 2014-07-01 LAB — CBC WITH DIFFERENTIAL/PLATELET
BASOS PCT: 0 % (ref 0–1)
Basophils Absolute: 0 10*3/uL (ref 0.0–0.1)
EOS PCT: 1 % (ref 0–5)
Eosinophils Absolute: 0.1 10*3/uL (ref 0.0–0.7)
HCT: 42.9 % (ref 36.0–46.0)
Hemoglobin: 14.3 g/dL (ref 12.0–15.0)
LYMPHS PCT: 19 % (ref 12–46)
Lymphs Abs: 1.9 10*3/uL (ref 0.7–4.0)
MCH: 30.9 pg (ref 26.0–34.0)
MCHC: 33.3 g/dL (ref 30.0–36.0)
MCV: 92.7 fL (ref 78.0–100.0)
Monocytes Absolute: 0.8 10*3/uL (ref 0.1–1.0)
Monocytes Relative: 8 % (ref 3–12)
NEUTROS ABS: 7.3 10*3/uL (ref 1.7–7.7)
NEUTROS PCT: 72 % (ref 43–77)
PLATELETS: 188 10*3/uL (ref 150–400)
RBC: 4.63 MIL/uL (ref 3.87–5.11)
RDW: 12.5 % (ref 11.5–15.5)
WBC: 10.1 10*3/uL (ref 4.0–10.5)

## 2014-07-01 LAB — BASIC METABOLIC PANEL
Anion gap: 3 — ABNORMAL LOW (ref 5–15)
BUN: 7 mg/dL (ref 6–23)
CHLORIDE: 109 mmol/L (ref 96–112)
CO2: 29 mmol/L (ref 19–32)
CREATININE: 0.89 mg/dL (ref 0.50–1.10)
Calcium: 8.6 mg/dL (ref 8.4–10.5)
GFR calc Af Amer: >90 mL/min (ref >90)
GFR calc non Af Amer: 80 mL/min — ABNORMAL LOW (ref >90)
GLUCOSE: 111 mg/dL — AB (ref 70–99)
Potassium: 3.8 mmol/L (ref 3.5–5.1)
SODIUM: 141 mmol/L (ref 135–145)

## 2014-07-01 LAB — TROPONIN I: Troponin I: <0.03 ng/mL (ref <0.031)

## 2014-07-01 MED ORDER — LORAZEPAM 1 MG PO TABS: 1.0000 mg | ORAL_TABLET | Freq: Three times a day (TID) | ORAL | Status: DC | PRN

## 2014-07-01 MED ORDER — LORAZEPAM 1 MG PO TABS
1.0000 mg | ORAL_TABLET | Freq: Once | ORAL | Status: AC
  Administered 2014-07-01: 1 mg via ORAL
  Filled 2014-07-01: qty 1

## 2014-07-01 NOTE — ED Notes (Signed)
Chest pain and palpitations since 0100- pt reports her PCP is setting her up with cardiology for evaluation and heart monitor

## 2014-07-01 NOTE — Discharge Instructions (Signed)

## 2014-07-01 NOTE — ED Provider Notes (Signed)
CSN: [id number]     Arrival date & time 07/01/14  1531 History  This chart was scribed for American Expressathan R. Rubin PayorPickering, MD by Tonye RoyaltyJoshua Chen, ED Scribe. This patient was seen in room MH03/MH03 and the patient's care was started at 4:04 PM.    Chief Complaint  Patient presents with  . Palpitations   The history is provided by the patient. No language interpreter was used.    HPI Comments: Beth Best is a 39 y.o. female who presents to the Emergency Department complaining of palpitations and sharp chest pain with onset at 0100 this morning, waking her from her sleep.She states she has been having intermittent palpitations for the past few months and her PCP is setting her up with cardiology for evaluation and heart monitor. She states she has never had chest pain with palpitations before. She states her chest pain is intermittent and only affects her when she is having palpitations. She reports associated dizziness. She describes the palpitations as if her heart is "flip flopping like a fish." She denies taking new medications in the past few months but states she has been having stress. She denies history of other cardiac problems. She has history of anxiety and states she used to take medication for it, including Valium, with some improvement. She is a smoker. She denies weight change, leg swelling, fever, cough, pregnancy, or vaginal discharge.   Past Medical History  Diagnosis Date  . Panic attacks   . Anxiety   . Chronic back pain   . Bipolar 1 disorder   . PTSD (post-traumatic stress disorder)    Past Surgical History  Procedure Laterality Date  . Cesarean section    . Abdominal hysterectomy    . Cholecystectomy    . Appendectomy     No family history on file. History  Substance Use Topics  . Smoking status: Current Some Day Smoker -- 0.50 packs/day    Types: Cigarettes  . Smokeless tobacco: Not on file  . Alcohol Use: No   OB History    No data available     Review of Systems   Constitutional: Negative for fever and unexpected weight change.  Respiratory: Negative for cough.   Cardiovascular: Positive for chest pain and palpitations. Negative for leg swelling.  Genitourinary: Negative for vaginal discharge.  Neurological: Positive for dizziness.  All other systems reviewed and are negative.     Allergies  Review of patient's allergies indicates no known allergies.  Home Medications   Prior to Admission medications   Medication Sig Start Date End Date Taking? Authorizing Provider  ciprofloxacin (CIPRO) 500 MG tablet Take 1 tablet (500 mg total) by mouth every 12 (twelve) hours. 04/15/13   Hope Orlene OchM Neese, NP  ciprofloxacin (CIPRO) 500 MG tablet Take 1 tablet (500 mg total) by mouth 2 (two) times daily. 10/27/13   Rolan BuccoMelanie Belfi, MD  citalopram (CELEXA) 40 MG tablet Take 40 mg by mouth daily.    Historical Provider, MD  clonazePAM (KLONOPIN) 1 MG tablet Take 1 mg by mouth 2 (two) times daily as needed for anxiety.    Historical Provider, MD  Diazepam (VALIUM PO) Take by mouth.    Historical Provider, MD  FLUoxetine (PROZAC) 20 MG tablet Take 20 mg by mouth daily.    Historical Provider, MD  gabapentin (NEURONTIN) 300 MG capsule Take 300 mg by mouth 3 (three) times daily.    Historical Provider, MD  HYDROcodone-acetaminophen (NORCO) 5-325 MG per tablet Take 1-2 tablets by mouth every  6 (six) hours as needed (for pain). 06/24/14   Carlisle Beers Molpus, MD  hydrocortisone-pramoxine (PROCTOFOAM HC) rectal foam Place 1 applicator rectally 2 (two) times daily. 05/12/12   Teressa Lower, NP  ibuprofen (ADVIL,MOTRIN) 400 MG tablet Take 1 tablet (400 mg total) by mouth every 6 (six) hours as needed for pain. 08/27/12   April K Palumbo-Rasch, MD  LORazepam (ATIVAN) 1 MG tablet Take 1 tablet (1 mg total) by mouth 3 (three) times daily as needed for anxiety. 07/01/14   Juliet Rude. Daren Doswell, MD  metoCLOPramide (REGLAN) 10 MG tablet Take 1 tablet (10 mg total) by mouth every 6 (six) hours as  needed (for headache/nausea). 06/24/14   John L Molpus, MD  ondansetron (ZOFRAN ODT) 4 MG disintegrating tablet Take 1 tablet (4 mg total) by mouth every 6 (six) hours as needed for nausea. 09/05/12   Lisette Paz, PA-C  phenazopyridine (PYRIDIUM) 200 MG tablet Take 1 tablet (200 mg total) by mouth 3 (three) times daily. 04/15/13   Hope Orlene Och, NP  phenazopyridine (PYRIDIUM) 200 MG tablet Take 1 tablet (200 mg total) by mouth 3 (three) times daily. 10/27/13   Rolan Bucco, MD  prazosin (MINIPRESS) 1 MG capsule Take 1 mg by mouth at bedtime.    Historical Provider, MD  QUEtiapine Fumarate (SEROQUEL PO) Take by mouth.    Historical Provider, MD  traMADol (ULTRAM) 50 MG tablet Take 1 tablet (50 mg total) by mouth every 6 (six) hours as needed. 06/18/14   Geoffery Lyons, MD  traZODone (DESYREL) 100 MG tablet Take 100 mg by mouth at bedtime.    Historical Provider, MD  UNKNOWN TO PATIENT "mood stabilizer"-unsure of name due to is new med    Historical Provider, MD   BP 152/93 mmHg  Pulse 123  Temp(Src) 98.4 F (36.9 C) (Oral)  Resp 20  Ht  (1.676 m)  Wt 125 lb (56.7 kg)  BMI 20.19 kg/m2  SpO2 96% Physical Exam  Constitutional: She is oriented to person, place, and time. She appears well-developed and well-nourished.  HENT:  Head: Normocephalic and atraumatic.  Eyes: Conjunctivae and EOM are normal. Pupils are equal, round, and reactive to light.  Neck: Normal range of motion. Neck supple. No thyromegaly present.  Cardiovascular: Regular rhythm.   No murmur heard. Mild tachycardia Strong peripheral pulses  Pulmonary/Chest: Effort normal and breath sounds normal. No respiratory distress. She has no wheezes. She has no rales. She exhibits no tenderness.  Abdominal: Soft. She exhibits no distension. There is no tenderness.  Musculoskeletal: Normal range of motion. She exhibits no edema (no peripheral edema).  Neurological: She is alert and oriented to person, place, and time.  Skin: Skin is  warm and dry. No pallor.  Psychiatric: She has a normal mood and affect.  Nursing note and vitals reviewed.   ED Course  Procedures (including critical care time)  DIAGNOSTIC STUDIES: Oxygen Saturation is 96% on room air, adequate by my interpretation.    COORDINATION OF CARE: 4:18 PM Discussed treatment plan with patient at beside, including EKG, chest x-ray, and blood work. The patient agrees with the plan and has no further questions at this time.   Labs Review Labs Reviewed  BASIC METABOLIC PANEL - Abnormal; Notable for the following:    Glucose, Bld 111 (*)    GFR calc non Af Amer 80 (*)    Anion gap 3 (*)    All other components within normal limits  CBC WITH DIFFERENTIAL/PLATELET  TROPONIN I  Imaging Review Dg Chest 2 View  07/01/2014   CLINICAL DATA:  Left-sided chest pain and palpitations. Smoking history.  EXAM: CHEST - 2 VIEW  COMPARISON:  None.  FINDINGS: The heart size and mediastinal contours are within normal limits. Both lungs are clear. The visualized skeletal structures are unremarkable. Surgical clips are present at the gallbladder fossa.  IMPRESSION: Negative two view chest.   Electronically Signed   By: Marin Roberts M.D.   On: 07/01/2014 16:54     EKG Interpretation   Date/Time:  Sunday July 01 2014 15:39:06 EST Ventricular Rate:  120 PR Interval:  114 QRS Duration: 90 QT Interval:  328 QTC Calculation: 463 R Axis:   86 Text Interpretation:  Sinus tachycardia Nonspecific ST and T wave  abnormality Abnormal ECG Confirmed by Rubin Payor  MD, Harrold Donath [id number]) on  07/01/2014 4:03:20 PM      MDM   Final diagnoses:  Palpitations  Anxiety    Patient with palpitations. Appears to come on with stress. EKG reassuring the first EEG had questions some hidden P waves vessel in the T waves. Resolved with the rate was slightly slower. Has follow-up with cardiology. Will discharge home. There may be an anxiety component. Patient states she's had a  lot of stress in her life.  I personally performed the services described in this documentation, which was scribed in my presence. The recorded information has been reviewed and is accurate.    Juliet Rude. Rubin Payor, MD 07/01/14 [phone]

## 2014-12-10 ENCOUNTER — Emergency Department (HOSPITAL_BASED_OUTPATIENT_CLINIC_OR_DEPARTMENT_OTHER): Payer: Self-pay

## 2014-12-10 ENCOUNTER — Emergency Department (HOSPITAL_BASED_OUTPATIENT_CLINIC_OR_DEPARTMENT_OTHER)
Admission: EM | Admit: 2014-12-10 | Discharge: 2014-12-10 | Disposition: A | Discharge status: Home / Self Care | Payer: Self-pay | Attending: Emergency Medicine | Admitting: Emergency Medicine

## 2014-12-10 ENCOUNTER — Encounter (HOSPITAL_BASED_OUTPATIENT_CLINIC_OR_DEPARTMENT_OTHER): Payer: Self-pay

## 2014-12-10 DIAGNOSIS — Y9289 Other specified places as the place of occurrence of the external cause: Secondary | ICD-10-CM | POA: Insufficient documentation

## 2014-12-10 DIAGNOSIS — Y998 Other external cause status: Secondary | ICD-10-CM | POA: Insufficient documentation

## 2014-12-10 DIAGNOSIS — R300 Dysuria: Secondary | ICD-10-CM | POA: Insufficient documentation

## 2014-12-10 DIAGNOSIS — G8929 Other chronic pain: Secondary | ICD-10-CM | POA: Insufficient documentation

## 2014-12-10 DIAGNOSIS — S92325A Nondisplaced fracture of second metatarsal bone, left foot, initial encounter for closed fracture: Secondary | ICD-10-CM | POA: Insufficient documentation

## 2014-12-10 DIAGNOSIS — X58XXXA Exposure to other specified factors, initial encounter: Secondary | ICD-10-CM | POA: Insufficient documentation

## 2014-12-10 DIAGNOSIS — Y9339 Activity, other involving climbing, rappelling and jumping off: Secondary | ICD-10-CM | POA: Insufficient documentation

## 2014-12-10 DIAGNOSIS — S92302A Fracture of unspecified metatarsal bone(s), left foot, initial encounter for closed fracture: Secondary | ICD-10-CM

## 2014-12-10 DIAGNOSIS — F419 Anxiety disorder, unspecified: Secondary | ICD-10-CM | POA: Insufficient documentation

## 2014-12-10 DIAGNOSIS — Z720 Tobacco use: Secondary | ICD-10-CM | POA: Insufficient documentation

## 2014-12-10 LAB — URINALYSIS, ROUTINE W REFLEX MICROSCOPIC
Bilirubin Urine: NEGATIVE
Glucose, UA: NEGATIVE mg/dL
Hgb urine dipstick: NEGATIVE
KETONES UR: NEGATIVE mg/dL
Leukocytes, UA: NEGATIVE
Nitrite: NEGATIVE
Protein, ur: NEGATIVE mg/dL
Specific Gravity, Urine: 1.007 (ref 1.005–1.030)
UROBILINOGEN UA: 0.2 mg/dL (ref 0.0–1.0)
pH: 6.5 (ref 5.0–8.0)

## 2014-12-10 MED ORDER — OXYCODONE-ACETAMINOPHEN 5-325 MG PO TABS
1.0000 | ORAL_TABLET | Freq: Once | ORAL | Status: AC
Start: 2014-12-10 — End: 2014-12-10
  Administered 2014-12-10: 1 via ORAL
  Filled 2014-12-10: qty 1

## 2014-12-10 MED ORDER — CLINDAMYCIN PHOSPHATE 600 MG/50ML IV SOLN
INTRAVENOUS | Status: AC
  Filled 2014-12-10: qty 50

## 2014-12-10 MED ORDER — NAPROXEN 500 MG PO TABS: 500.0000 mg | ORAL_TABLET | Freq: Two times a day (BID) | ORAL | Status: DC

## 2014-12-10 MED ORDER — PHENAZOPYRIDINE HCL 100 MG PO TABS
95.0000 mg | ORAL_TABLET | Freq: Once | ORAL | Status: AC
  Administered 2014-12-10: 100 mg via ORAL
  Filled 2014-12-10: qty 1

## 2014-12-10 MED ORDER — PHENAZOPYRIDINE HCL 200 MG PO TABS: 200.0000 mg | ORAL_TABLET | Freq: Three times a day (TID) | ORAL | Status: DC

## 2014-12-10 NOTE — ED Notes (Signed)
Left foot injury 1 week ago-dysuria x 2 days

## 2014-12-10 NOTE — ED Notes (Signed)
Ace wrap applied per order.  PMS intact.

## 2014-12-10 NOTE — ED Notes (Signed)
When asked pt admitts to beening seen 3 days ago for same compliants at [facility], states she can not follow up with ortho d/t insurance

## 2014-12-10 NOTE — Discharge Instructions (Signed)
Metatarsal Fracture, Undisplaced Take naproxen for pain. Take Pyridium for painful urination.  follow-up with her primary care physician and orthopedist. A metatarsal fracture is a break in the bone(s) of the foot. These are the bones of the foot that connect your toes to the bones of the ankle. DIAGNOSIS  The diagnoses of these fractures are usually made with X-rays. If there are problems in the forefoot and x-rays are normal a later bone scan will usually make the diagnosis.  TREATMENT AND HOME CARE INSTRUCTIONS  Treatment may or may not include a cast or walking shoe. When casts are needed the use is usually for short periods of time so as not to slow down healing with muscle wasting (atrophy).  Activities should be stopped until further advised by your caregiver.  Wear shoes with adequate shock absorbing capabilities and stiff soles.  Alternative exercise may be undertaken while waiting for healing. These may include bicycling and swimming, or as your caregiver suggests.  It is important to keep all follow-up visits or specialty referrals. The failure to keep these appointments could result in improper bone healing and chronic pain or disability.  Warning: Do not drive a car or operate a motor vehicle until your caregiver specifically tells you it is safe to do so. IF YOU DO NOT HAVE A CAST OR SPLINT:  You may walk on your injured foot as tolerated or advised.  Do not put any weight on your injured foot for as long as directed by your caregiver. Slowly increase the amount of time you walk on the foot as the pain allows or as advised.  Use crutches until you can bear weight without pain. A gradual increase in weight bearing may help.  Apply ice to the injury for 15-20 minutes each hour while awake for the first 2 days. Put the ice in a plastic bag and place a towel between the bag of ice and your skin.  Only take over-the-counter or prescription medicines for pain, discomfort, or fever  as directed by your caregiver. SEEK IMMEDIATE MEDICAL CARE IF:   Your cast gets damaged or breaks.  You have continued severe pain or more swelling than you did before the cast was put on, or the pain is not controlled with medications.  Your skin or nails below the injury turn blue or grey, or feel cold or numb.  There is a bad smell, or new stains or pus-like (purulent) drainage coming from the cast. MAKE SURE YOU:   Understand these instructions.  Will watch your condition.  Will get help right away if you are not doing well or get worse. Document Released: 01/10/2002 Document Revised: 07/13/2011 Document Reviewed: 12/02/2007 [facility] Patient Information 2015 [place], [place]. This information is not intended to replace advice given to you by your health care provider. Make sure you discuss any questions you have with your health care provider.

## 2014-12-10 NOTE — ED Notes (Signed)
Patient transported to X-ray 

## 2014-12-10 NOTE — ED Provider Notes (Signed)
CSN: [id number]     Arrival date & time 12/10/14  1538 History   First MD Initiated Contact with Patient 12/10/14 1556     Chief Complaint  Patient presents with  . Dysuria  . Foot Injury     (Consider location/radiation/quality/duration/timing/severity/associated sxs/prior Treatment) Patient is a 39 y.o. female presenting with dysuria and foot injury. The history is provided by the patient. No language interpreter was used.  Dysuria Foot Injury Ms. Lucente is a 39 y.o female with a history of panic attacks, anxiety, bipolar disorder, chronic back pain, and PTSD who presents for left foot pain after jumping off a bunk bed 4 days ago. She is also complaining of dysuria and was given Cipro for the urinary tract infection and states that it has been working but still has some burning when she urinates. She states she has been taking Tylenol for foot pain without relief. She denies any fever, chills, nausea, vomiting, urinary frequency, hematuria, vaginal discharge or odor, vaginal bleeding for back pain. She had a full hysterectomy. She denies any numbness or tingling to the foot. She states she has had multiple UTIs in the past.  Past Medical History  Diagnosis Date  . Panic attacks   . Anxiety   . Chronic back pain   . Bipolar 1 disorder   . PTSD (post-traumatic stress disorder)    Past Surgical History  Procedure Laterality Date  . Cesarean section    . Abdominal hysterectomy    . Cholecystectomy    . Appendectomy     No family history on file. History  Substance Use Topics  . Smoking status: Current Some Day Smoker -- 0.50 packs/day    Types: Cigarettes  . Smokeless tobacco: Not on file  . Alcohol Use: No   OB History    No data available     Review of Systems  Genitourinary: Positive for dysuria.  Musculoskeletal: Positive for arthralgias and gait problem.  Neurological: Negative for numbness.  All other systems reviewed and are negative.     Allergies  Review of  patient's allergies indicates no known allergies.  Home Medications   Prior to Admission medications   Medication Sig Start Date End Date Taking? Authorizing Provider  Diazepam (VALIUM PO) Take by mouth.    Historical Provider, MD  naproxen (NAPROSYN) 500 MG tablet Take 1 tablet (500 mg total) by mouth 2 (two) times daily. 12/10/14   Safaa Stingley Patel-Mills, PA-C  phenazopyridine (PYRIDIUM) 200 MG tablet Take 1 tablet (200 mg total) by mouth 3 (three) times daily. 12/10/14   Hykeem Ojeda Patel-Mills, PA-C   BP 144/92 mmHg  Pulse 97  Temp(Src) 98.2 F (36.8 C) (Oral)  Resp 18  SpO2 97% Physical Exam  Constitutional: She is oriented to person, place, and time. She appears well-developed and well-nourished.  HENT:  Head: Normocephalic and atraumatic.  Eyes: Conjunctivae are normal.  Neck: Normal range of motion. Neck supple.  Cardiovascular: Normal rate.   Pulmonary/Chest: Effort normal. No respiratory distress.  Abdominal: Soft. She exhibits no distension and no mass. There is no tenderness. There is no rebound, no guarding and no CVA tenderness.  No CVA tenderness to palpation.  Musculoskeletal: Normal range of motion.  Left Foot: She is able to plantar and dorsiflex. 2+ DP pulse. <2 second capillary refill. Mild swelling above the first and second metatarsal. No ankle swelling or edema. Able to flex and extend toes without difficulty. No obvious deformity.  Neurological: She is alert and oriented to person, place,  and time.  Skin: Skin is warm and dry.  Psychiatric: She has a normal mood and affect. Her behavior is normal.  Nursing note and vitals reviewed.   ED Course  Procedures (including critical care time) Labs Review Labs Reviewed  URINALYSIS, ROUTINE W REFLEX MICROSCOPIC (NOT AT [facility])    Imaging Review Dg Foot Complete Left  12/10/2014   CLINICAL DATA:  39 year old female who jumped from bunk bed 5 days ago with left foot pain. Injury. Initial encounter.  EXAM: LEFT FOOT - COMPLETE 3+  VIEW  COMPARISON:  None.  FINDINGS: Bone mineralization is within normal limits. Calcaneus appears intact. There is mild hallux valgus and metatarsus primus varus associated with first MTP joint space loss and mild degenerative spurring. Other joint spaces and alignment are within normal limits in the left foot.  There is a subtle nondisplaced fracture through the proximal shaft of the second metatarsal (arrows). The other metatarsals appear intact. No other acute fracture or dislocation.  IMPRESSION: 1. Nondisplaced transverse fracture through the proximal shaft of the second metatarsal. 2. 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   Electronically Signed   By: Odessa Fleming M.D.   On: 12/10/2014 16:04     EKG Interpretation None      MDM   Final diagnoses:  Dysuria  Metatarsal fracture, left, closed, initial encounter  Patient presents for dysuria and left foot pain. She was evaluated at [facility] 3 days ago with a negative foot x-ray and UTI. She was given Cipro for the UTI and 20 hydrocodone for her foot pain. She presents today for continued for pain and states that her UTI symptoms are better but she still has some burning with urination. I discussed that we would Ace wrap the left foot and give her crutches. I also discussed giving her pyridium for dysuria and continuing to take the Cipro. She was given orthopedic follow-up, naproxen for pain, and verbally agrees with the plan.     Catha Gosselin, PA-C 12/10/14 2125  Richardean Canal, MD 12/10/14 [phone]

## 2015-04-15 ENCOUNTER — Encounter (HOSPITAL_BASED_OUTPATIENT_CLINIC_OR_DEPARTMENT_OTHER): Payer: Self-pay | Admitting: Emergency Medicine

## 2015-04-15 ENCOUNTER — Emergency Department (HOSPITAL_BASED_OUTPATIENT_CLINIC_OR_DEPARTMENT_OTHER)
Admission: EM | Admit: 2015-04-15 | Discharge: 2015-04-15 | Disposition: A | Discharge status: Home / Self Care | Payer: Self-pay | Attending: Emergency Medicine | Admitting: Emergency Medicine

## 2015-04-15 DIAGNOSIS — Z79899 Other long term (current) drug therapy: Secondary | ICD-10-CM | POA: Insufficient documentation

## 2015-04-15 DIAGNOSIS — Z3202 Encounter for pregnancy test, result negative: Secondary | ICD-10-CM | POA: Insufficient documentation

## 2015-04-15 DIAGNOSIS — Z791 Long term (current) use of non-steroidal anti-inflammatories (NSAID): Secondary | ICD-10-CM | POA: Insufficient documentation

## 2015-04-15 DIAGNOSIS — F319 Bipolar disorder, unspecified: Secondary | ICD-10-CM | POA: Insufficient documentation

## 2015-04-15 DIAGNOSIS — N390 Urinary tract infection, site not specified: Secondary | ICD-10-CM | POA: Insufficient documentation

## 2015-04-15 DIAGNOSIS — F4310 Post-traumatic stress disorder, unspecified: Secondary | ICD-10-CM | POA: Insufficient documentation

## 2015-04-15 DIAGNOSIS — F410 Panic disorder [episodic paroxysmal anxiety] without agoraphobia: Secondary | ICD-10-CM | POA: Insufficient documentation

## 2015-04-15 DIAGNOSIS — F17210 Nicotine dependence, cigarettes, uncomplicated: Secondary | ICD-10-CM | POA: Insufficient documentation

## 2015-04-15 DIAGNOSIS — G8929 Other chronic pain: Secondary | ICD-10-CM | POA: Insufficient documentation

## 2015-04-15 LAB — URINE MICROSCOPIC-ADD ON

## 2015-04-15 LAB — URINALYSIS, ROUTINE W REFLEX MICROSCOPIC
BILIRUBIN URINE: NEGATIVE
GLUCOSE, UA: NEGATIVE mg/dL
KETONES UR: NEGATIVE mg/dL
Nitrite: POSITIVE — AB
PH: 6 (ref 5.0–8.0)
PROTEIN: NEGATIVE mg/dL
Specific Gravity, Urine: 1.008 (ref 1.005–1.030)

## 2015-04-15 LAB — PREGNANCY, URINE: PREG TEST UR: NEGATIVE

## 2015-04-15 MED ORDER — LEVOFLOXACIN 500 MG PO TABS: ORAL_TABLET | ORAL | Status: DC

## 2015-04-15 MED ORDER — LEVOFLOXACIN 500 MG PO TABS
500.0000 mg | ORAL_TABLET | Freq: Once | ORAL | Status: AC
  Administered 2015-04-15: 500 mg via ORAL
  Filled 2015-04-15: qty 1

## 2015-04-15 MED ORDER — OXYCODONE-ACETAMINOPHEN 5-325 MG PO TABS: 2.0000 | ORAL_TABLET | Freq: Once | ORAL | Status: DC

## 2015-04-15 MED ORDER — OXYCODONE-ACETAMINOPHEN 5-325 MG PO TABS
2.0000 | ORAL_TABLET | Freq: Once | ORAL | Status: AC
  Administered 2015-04-15: 2 via ORAL
  Filled 2015-04-15: qty 2

## 2015-04-15 NOTE — ED Notes (Signed)
Patient states that she has lower back pain x 1 day. Reports pain with urination

## 2015-04-15 NOTE — Discharge Instructions (Signed)

## 2015-04-15 NOTE — ED Provider Notes (Signed)
CSN: [id number]     Arrival date & time 04/15/15  2038 History  By signing my name below, I, Gonzella LexKimberly Bianca Gray, attest that this documentation has been prepared under the direction and in the presence of Nelva Nayobert Mertie Haslem, MD. Electronically Signed: Gonzella LexKimberly Bianca Gray, Scribe. 04/15/2015. 10:22 PM.    Chief Complaint  Patient presents with  . Back Pain    The history is provided by the patient. No language interpreter was used.   HPI Comments: Beth Best is a 39 y.o. female who presents to the Emergency Department complaining of gradually worsening back pain, pelvic pressure, and frequent urination onset two days ago. Pt reports trying to urinate in the restroom for four consecutive hours today with little urine production and states that she constantly feels like she has to urinate. She also reports recent cough and congestion but is unsure of fever. Pt denies vomiting. Pt has NKDA.   Past Medical History  Diagnosis Date  . Panic attacks   . Anxiety   . Chronic back pain   . Bipolar 1 disorder (HCC)   . PTSD (post-traumatic stress disorder)    Past Surgical History  Procedure Laterality Date  . Cesarean section    . Abdominal hysterectomy    . Cholecystectomy    . Appendectomy     History reviewed. No pertinent family history. Social History  Substance Use Topics  . Smoking status: Current Some Day Smoker -- 0.50 packs/day    Types: Cigarettes  . Smokeless tobacco: None  . Alcohol Use: No   OB History    No data available     Review of Systems  A complete 10 system review of systems was obtained and all systems are negative except as noted in the HPI and PMH.   Allergies  Review of patient's allergies indicates no known allergies.  Home Medications   Prior to Admission medications   Medication Sig Start Date End Date Taking? Authorizing Provider  FLUoxetine (PROZAC) 20 MG capsule Take 20 mg by mouth daily.   Yes Historical Provider, MD  sertraline  (ZOLOFT) 50 MG tablet Take 50 mg by mouth daily.   Yes Historical Provider, MD  Diazepam (VALIUM PO) Take by mouth.    Historical Provider, MD  levofloxacin (LEVAQUIN) 500 MG tablet Take 1 tablet twice a day until gone 04/15/15   Nelva Nayobert Valdemar Mcclenahan, MD  naproxen (NAPROSYN) 500 MG tablet Take 1 tablet (500 mg total) by mouth 2 (two) times daily. 12/10/14   Hanna Patel-Mills, PA-C  oxyCODONE-acetaminophen (PERCOCET/ROXICET) 5-325 MG tablet Take 2 tablets by mouth once. 04/15/15   Nelva Nayobert Cherene Dobbins, MD  phenazopyridine (PYRIDIUM) 200 MG tablet Take 1 tablet (200 mg total) by mouth 3 (three) times daily. 12/10/14   Hanna Patel-Mills, PA-C   BP 112/68 mmHg  Pulse 79  Temp(Src) 98.9 F (37.2 C) (Oral)  Resp 18  Ht 5\' 6"  (1.676 m)  Wt 130 lb (58.968 kg)  BMI 20.99 kg/m2  SpO2 94% Physical Exam  Constitutional: She is oriented to person, place, and time. She appears well-developed and well-nourished. No distress.  HENT:  Head: Normocephalic and atraumatic.  Eyes: Pupils are equal, round, and reactive to light.  Neck: Normal range of motion.  Cardiovascular: Normal rate and intact distal pulses.   Pulmonary/Chest: No respiratory distress. She has no wheezes.  Abdominal: Soft. Normal appearance. She exhibits no distension. There is no tenderness. There is no rebound.  Musculoskeletal: Normal range of motion.  No CVA tenderness to percussion  Neurological: She is alert and oriented to person, place, and time. No cranial nerve deficit.  Skin: Skin is warm and dry. No rash noted.  Psychiatric: She has a normal mood and affect. Her behavior is normal.  Nursing note and vitals reviewed.   ED Course  Procedures  Medications  levofloxacin (LEVAQUIN) tablet 500 mg (not administered)  oxyCODONE-acetaminophen (PERCOCET/ROXICET) 5-325 MG per tablet 2 tablet (not administered)    DIAGNOSTIC STUDIES:    Oxygen Saturation is 94% on RA, adequate by my interpretation.   COORDINATION OF CARE:  10:11 PM  Reviewed labs. Will prescribe pt pain medication and levofloxacin.  Will discharge pt with work note. Discussed treatment plan with pt at bedside and pt agreed to plan.    Labs Review Labs Reviewed  URINALYSIS, ROUTINE W REFLEX MICROSCOPIC (NOT AT [facility]) - Abnormal; Notable for the following:    Color, Urine ORANGE (*)    Hgb urine dipstick TRACE (*)    Nitrite POSITIVE (*)    Leukocytes, UA SMALL (*)    All other components within normal limits  URINE MICROSCOPIC-ADD ON - Abnormal; Notable for the following:    Squamous Epithelial / LPF 0-5 (*)    Bacteria, UA FEW (*)    All other components within normal limits  PREGNANCY, URINE      MDM   Final diagnoses:  UTI (urinary tract infection), uncomplicated    I personally performed the services described in this documentation, which was scribed in my presence. The recorded information has been reviewed and considered.    Nelva Nay, MD 04/15/15 [phone]

## 2015-04-16 ENCOUNTER — Telehealth: Payer: Self-pay | Admitting: *Deleted

## 2015-04-16 NOTE — Telephone Encounter (Signed)
Pharmacy called related to Rx: oxyCODONE-acetaminophen (PERCOCET/ROXICET) 5-325 MG tablet 04/15/15 -- Nelva Nayobert Beaton, MD Take 2 tablets by mouth once.  ..Marland Kitchen.NCM clarified with EDP Victory Dakin(Riley) to change Rx to: Take 2 tablets my mouth every 8 hours as needed for pain.

## 2015-05-07 ENCOUNTER — Encounter (HOSPITAL_BASED_OUTPATIENT_CLINIC_OR_DEPARTMENT_OTHER): Payer: Self-pay | Admitting: *Deleted

## 2015-05-07 ENCOUNTER — Emergency Department (HOSPITAL_BASED_OUTPATIENT_CLINIC_OR_DEPARTMENT_OTHER)
Admission: EM | Admit: 2015-05-07 | Discharge: 2015-05-07 | Disposition: A | Discharge status: Home / Self Care | Payer: Self-pay | Attending: Emergency Medicine | Admitting: Emergency Medicine

## 2015-05-07 ENCOUNTER — Emergency Department (HOSPITAL_BASED_OUTPATIENT_CLINIC_OR_DEPARTMENT_OTHER): Payer: Self-pay

## 2015-05-07 DIAGNOSIS — Y998 Other external cause status: Secondary | ICD-10-CM | POA: Insufficient documentation

## 2015-05-07 DIAGNOSIS — F17210 Nicotine dependence, cigarettes, uncomplicated: Secondary | ICD-10-CM | POA: Insufficient documentation

## 2015-05-07 DIAGNOSIS — S99922A Unspecified injury of left foot, initial encounter: Secondary | ICD-10-CM | POA: Insufficient documentation

## 2015-05-07 DIAGNOSIS — W108XXA Fall (on) (from) other stairs and steps, initial encounter: Secondary | ICD-10-CM | POA: Insufficient documentation

## 2015-05-07 DIAGNOSIS — R300 Dysuria: Secondary | ICD-10-CM

## 2015-05-07 DIAGNOSIS — F319 Bipolar disorder, unspecified: Secondary | ICD-10-CM | POA: Insufficient documentation

## 2015-05-07 DIAGNOSIS — F410 Panic disorder [episodic paroxysmal anxiety] without agoraphobia: Secondary | ICD-10-CM | POA: Insufficient documentation

## 2015-05-07 DIAGNOSIS — W19XXXA Unspecified fall, initial encounter: Secondary | ICD-10-CM

## 2015-05-07 DIAGNOSIS — G8929 Other chronic pain: Secondary | ICD-10-CM | POA: Insufficient documentation

## 2015-05-07 DIAGNOSIS — Y9301 Activity, walking, marching and hiking: Secondary | ICD-10-CM | POA: Insufficient documentation

## 2015-05-07 DIAGNOSIS — Y9289 Other specified places as the place of occurrence of the external cause: Secondary | ICD-10-CM | POA: Insufficient documentation

## 2015-05-07 DIAGNOSIS — Z79899 Other long term (current) drug therapy: Secondary | ICD-10-CM | POA: Insufficient documentation

## 2015-05-07 DIAGNOSIS — M79672 Pain in left foot: Secondary | ICD-10-CM

## 2015-05-07 DIAGNOSIS — F4310 Post-traumatic stress disorder, unspecified: Secondary | ICD-10-CM | POA: Insufficient documentation

## 2015-05-07 LAB — URINALYSIS, ROUTINE W REFLEX MICROSCOPIC
Bilirubin Urine: NEGATIVE
GLUCOSE, UA: NEGATIVE mg/dL
Hgb urine dipstick: NEGATIVE
Ketones, ur: NEGATIVE mg/dL
LEUKOCYTES UA: NEGATIVE
Nitrite: NEGATIVE
PH: 6 (ref 5.0–8.0)
PROTEIN: NEGATIVE mg/dL
Specific Gravity, Urine: 1.003 — ABNORMAL LOW (ref 1.005–1.030)

## 2015-05-07 MED ORDER — METHOCARBAMOL 500 MG PO TABS: 500.0000 mg | ORAL_TABLET | Freq: Two times a day (BID) | ORAL | Status: DC

## 2015-05-07 MED ORDER — NAPROXEN 500 MG PO TABS: 500.0000 mg | ORAL_TABLET | Freq: Two times a day (BID) | ORAL | Status: DC

## 2015-05-07 NOTE — ED Notes (Signed)
D/c instructions reviewed with pt, pt states "I can't believe I waiting 5 fucking hours and I'm not getting anything for pain!" this rn reviews pt rx for pain medications, pt states 'I need something stronger than that! I wanted some percocet or something, that's the only thing that works for me!" pt is standing and walking with aso in place. Pt yelling at this rn, "I can't believe I waited all this time and all you're giving me is aspirin!" again attempted to explain to pt and her companion that naproxen sodium and robaxin have been written. Pt states "I told you I need something fucking stronger than that!" pt leaves ed amb in nad.

## 2015-05-07 NOTE — ED Notes (Signed)
Ice pack applied in waiting room by nurse 1st, Jarid Sasso, rn

## 2015-05-07 NOTE — Discharge Instructions (Signed)
Take the prescribed medication as directed. °Follow-up with your primary care physician. °Return to the ED for new or worsening symptoms. ° °
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
## 2015-05-23 ENCOUNTER — Emergency Department (HOSPITAL_BASED_OUTPATIENT_CLINIC_OR_DEPARTMENT_OTHER): Payer: Self-pay

## 2015-05-23 ENCOUNTER — Encounter (HOSPITAL_BASED_OUTPATIENT_CLINIC_OR_DEPARTMENT_OTHER): Payer: Self-pay | Admitting: Emergency Medicine

## 2015-05-23 ENCOUNTER — Emergency Department (HOSPITAL_BASED_OUTPATIENT_CLINIC_OR_DEPARTMENT_OTHER)
Admission: EM | Admit: 2015-05-23 | Discharge: 2015-05-23 | Disposition: A | Discharge status: Home / Self Care | Payer: Self-pay | Attending: Emergency Medicine | Admitting: Emergency Medicine

## 2015-05-23 DIAGNOSIS — F17210 Nicotine dependence, cigarettes, uncomplicated: Secondary | ICD-10-CM | POA: Insufficient documentation

## 2015-05-23 DIAGNOSIS — F319 Bipolar disorder, unspecified: Secondary | ICD-10-CM | POA: Insufficient documentation

## 2015-05-23 DIAGNOSIS — G8929 Other chronic pain: Secondary | ICD-10-CM | POA: Insufficient documentation

## 2015-05-23 DIAGNOSIS — N390 Urinary tract infection, site not specified: Secondary | ICD-10-CM

## 2015-05-23 DIAGNOSIS — Z3202 Encounter for pregnancy test, result negative: Secondary | ICD-10-CM | POA: Insufficient documentation

## 2015-05-23 DIAGNOSIS — F410 Panic disorder [episodic paroxysmal anxiety] without agoraphobia: Secondary | ICD-10-CM | POA: Insufficient documentation

## 2015-05-23 DIAGNOSIS — R109 Unspecified abdominal pain: Secondary | ICD-10-CM

## 2015-05-23 DIAGNOSIS — F4310 Post-traumatic stress disorder, unspecified: Secondary | ICD-10-CM | POA: Insufficient documentation

## 2015-05-23 DIAGNOSIS — Z79899 Other long term (current) drug therapy: Secondary | ICD-10-CM | POA: Insufficient documentation

## 2015-05-23 LAB — URINALYSIS, ROUTINE W REFLEX MICROSCOPIC
Bilirubin Urine: NEGATIVE
GLUCOSE, UA: NEGATIVE mg/dL
Hgb urine dipstick: NEGATIVE
Ketones, ur: NEGATIVE mg/dL
LEUKOCYTES UA: NEGATIVE
Nitrite: NEGATIVE
PH: 5.5 (ref 5.0–8.0)
Protein, ur: NEGATIVE mg/dL
SPECIFIC GRAVITY, URINE: 1.024 (ref 1.005–1.030)

## 2015-05-23 LAB — COMPREHENSIVE METABOLIC PANEL
ALBUMIN: 4.3 g/dL (ref 3.5–5.0)
ALK PHOS: 66 U/L (ref 38–126)
ALT: 16 U/L (ref 14–54)
ANION GAP: 11 (ref 5–15)
AST: 28 U/L (ref 15–41)
BUN: 6 mg/dL (ref 6–20)
CHLORIDE: 106 mmol/L (ref 101–111)
CO2: 25 mmol/L (ref 22–32)
Calcium: 9 mg/dL (ref 8.9–10.3)
Creatinine, Ser: 0.75 mg/dL (ref 0.44–1.00)
GFR calc Af Amer: >60 mL/min (ref >60)
GFR calc non Af Amer: >60 mL/min (ref >60)
GLUCOSE: 81 mg/dL (ref 65–99)
POTASSIUM: 3.9 mmol/L (ref 3.5–5.1)
SODIUM: 142 mmol/L (ref 135–145)
Total Bilirubin: 0.4 mg/dL (ref 0.3–1.2)
Total Protein: 7.3 g/dL (ref 6.5–8.1)

## 2015-05-23 LAB — URINE MICROSCOPIC-ADD ON

## 2015-05-23 LAB — PREGNANCY, URINE: Preg Test, Ur: NEGATIVE

## 2015-05-23 LAB — CBC WITH DIFFERENTIAL/PLATELET
BASOS PCT: 0 %
Basophils Absolute: 0 10*3/uL (ref 0.0–0.1)
EOS ABS: 0.1 10*3/uL (ref 0.0–0.7)
Eosinophils Relative: 1 %
HCT: 43.7 % (ref 36.0–46.0)
HEMOGLOBIN: 14.3 g/dL (ref 12.0–15.0)
Lymphocytes Relative: 21 %
Lymphs Abs: 2.1 10*3/uL (ref 0.7–4.0)
MCH: 30 pg (ref 26.0–34.0)
MCHC: 32.7 g/dL (ref 30.0–36.0)
MCV: 91.6 fL (ref 78.0–100.0)
MONOS PCT: 10 %
Monocytes Absolute: 1 10*3/uL (ref 0.1–1.0)
NEUTROS PCT: 68 %
Neutro Abs: 6.6 10*3/uL (ref 1.7–7.7)
PLATELETS: 213 10*3/uL (ref 150–400)
RBC: 4.77 MIL/uL (ref 3.87–5.11)
RDW: 12.6 % (ref 11.5–15.5)
WBC: 9.9 10*3/uL (ref 4.0–10.5)

## 2015-05-23 MED ORDER — HYDROMORPHONE HCL 1 MG/ML IJ SOLN
1.0000 mg | Freq: Once | INTRAMUSCULAR | Status: AC
  Administered 2015-05-23: 1 mg via INTRAVENOUS
  Filled 2015-05-23: qty 1

## 2015-05-23 MED ORDER — DEXTROSE 5 % IV SOLN
1.0000 g | Freq: Once | INTRAVENOUS | Status: AC
  Administered 2015-05-23: 1 g via INTRAVENOUS

## 2015-05-23 MED ORDER — CEPHALEXIN 500 MG PO CAPS: 500.0000 mg | ORAL_CAPSULE | Freq: Four times a day (QID) | ORAL | Status: DC

## 2015-05-23 MED ORDER — SODIUM CHLORIDE 0.9 % IV BOLUS (SEPSIS)
1000.0000 mL | Freq: Once | INTRAVENOUS | Status: AC
  Administered 2015-05-23: 1000 mL via INTRAVENOUS

## 2015-05-23 MED ORDER — CEFTRIAXONE SODIUM 1 G IJ SOLR
INTRAMUSCULAR | Status: AC
  Filled 2015-05-23: qty 10

## 2015-05-23 NOTE — ED Notes (Signed)
Pt reports severe back pain x several weeks and this am awoke with blood in urine, pt is tearful in triage and stating something is just wrong

## 2015-05-23 NOTE — ED Notes (Signed)
Pt states that she was prescribed antibiotic 1/3 but was unable to afford it. Per last avs no antibiotic was prescribed asked what pharmacy she took script to so I could find out name of antibiotic. Pt stated that she just called around and asked, never took to pharmacy and does not know name of script she called around and asked about

## 2015-05-23 NOTE — ED Notes (Signed)
Pt states that last time she was in ed in early July she was prescribed antibiotic for uti, but could not afford medication so did not get medicine filled

## 2015-05-23 NOTE — ED Provider Notes (Signed)
CSN: [id number]     Arrival date & time 05/23/15  1014 History   None    Chief Complaint  Patient presents with  . Abdominal Pain     (Consider location/radiation/quality/duration/timing/severity/associated sxs/prior Treatment) Patient is a 40 y.o. female presenting with abdominal pain.  Abdominal Pain Pain location:  Suprapubic Pain quality: aching   Pain radiates to:  L flank and R flank Pain severity:  Mild Onset quality:  Gradual Duration:  3 days Timing:  Constant Progression:  Worsening Chronicity:  Recurrent Associated symptoms: chills, fever and nausea     Past Medical History  Diagnosis Date  . Panic attacks   . Anxiety   . Chronic back pain   . Bipolar 1 disorder (HCC)   . PTSD (post-traumatic stress disorder)    Past Surgical History  Procedure Laterality Date  . Cesarean section    . Abdominal hysterectomy    . Cholecystectomy    . Appendectomy     History reviewed. No pertinent family history. Social History  Substance Use Topics  . Smoking status: Current Some Day Smoker -- 0.50 packs/day    Types: Cigarettes  . Smokeless tobacco: None  . Alcohol Use: No   OB History    No data available     Review of Systems  Constitutional: Positive for fever and chills.  Gastrointestinal: Positive for nausea and abdominal pain.  Musculoskeletal: Positive for back pain.  Skin: Negative for rash and wound.  All other systems reviewed and are negative.     Allergies  Review of patient's allergies indicates no known allergies.  Home Medications   Prior to Admission medications   Medication Sig Start Date End Date Taking? Authorizing Provider  sertraline (ZOLOFT) 50 MG tablet Take 50 mg by mouth daily.   Yes Historical Provider, MD  cephALEXin (KEFLEX) 500 MG capsule Take 1 capsule (500 mg total) by mouth 4 (four) times daily. 05/23/15   Marily Memos, MD  FLUoxetine (PROZAC) 20 MG capsule Take 20 mg by mouth daily.    Historical Provider, MD   levofloxacin (LEVAQUIN) 500 MG tablet Take 1 tablet twice a day until gone 04/15/15   Nelva Nay, MD  methocarbamol (ROBAXIN) 500 MG tablet Take 1 tablet (500 mg total) by mouth 2 (two) times daily. 05/07/15   Garlon Hatchet, PA-C  naproxen (NAPROSYN) 500 MG tablet Take 1 tablet (500 mg total) by mouth 2 (two) times daily with a meal. 05/07/15   Garlon Hatchet, PA-C  oxyCODONE-acetaminophen (PERCOCET/ROXICET) 5-325 MG tablet Take 2 tablets by mouth once. 04/15/15   Nelva Nay, MD  phenazopyridine (PYRIDIUM) 200 MG tablet Take 1 tablet (200 mg total) by mouth 3 (three) times daily. 12/10/14   Hanna Patel-Mills, PA-C   BP 149/89 mmHg  Pulse 109  Temp(Src) 98.4 F (36.9 C) (Oral)  Resp 20  Wt 130 lb (58.968 kg)  SpO2 100% Physical Exam  Constitutional: She is oriented to person, place, and time. She appears well-developed and well-nourished.  HENT:  Head: Normocephalic and atraumatic.  Neck: Normal range of motion.  Cardiovascular: Normal rate and regular rhythm.   Pulmonary/Chest: No stridor. No respiratory distress.  Abdominal: She exhibits no distension. There is tenderness (suprapubic).  Musculoskeletal: Normal range of motion. She exhibits tenderness (bilateral CVA). She exhibits no edema.  Neurological: She is alert and oriented to person, place, and time. No cranial nerve deficit. Coordination normal.  Skin: No rash noted.  Nursing note and vitals reviewed.   ED Course  Procedures (including critical care time) Labs Review Labs Reviewed  URINALYSIS, ROUTINE W REFLEX MICROSCOPIC (NOT AT [facility]) - Abnormal; Notable for the following:    Color, Urine AMBER (*)    APPearance TURBID (*)    All other components within normal limits  URINE MICROSCOPIC-ADD ON - Abnormal; Notable for the following:    Squamous Epithelial / LPF 6-30 (*)    Bacteria, UA MANY (*)    All other components within normal limits  CBC WITH DIFFERENTIAL/PLATELET  COMPREHENSIVE METABOLIC PANEL  PREGNANCY,  URINE    Imaging Review Ct Renal Stone Study  05/23/2015  CLINICAL DATA:  Low back pain for 2 weeks, having now pelvic pain and hematuria EXAM: CT ABDOMEN AND PELVIS WITHOUT CONTRAST TECHNIQUE: Multidetector CT imaging of the abdomen and pelvis was performed following the standard protocol without IV contrast. COMPARISON:  01/22/2014 FINDINGS: The lung bases are unremarkable. Minimal atelectasis or scarring lung bases posteriorly. Sagittal images of the spine are unremarkable. There is Schmorl's node deformity upper endplate of L5 and L3 vertebral body. Status postcholecystectomy. No intrahepatic biliary ductal dilatation. Unenhanced pancreas, spleen and adrenal glands are unremarkable. Unenhanced kidneys are symmetrical in size. There is a punctate nonobstructive calcified calculus in midpole anterior aspect of the right kidney measures 2 mm. 1.2 cm angio lipoma lateral margin of the right kidney and midpole the is stable in size in appearance from prior exam. Stable mild dilatation of the right extrarenal pelvis without evidence of hydronephrosis. No hydronephrosis or hydroureter bilaterally. No calcified ureteral calculi. No aortic aneurysm. No ascites or free air.  No adenopathy. No pericecal inflammation. The patient is status post appendectomy. There are mild fluid distended small bowel loops within pelvis. Mild ileus or enteritis cannot be excluded. No transition point in caliber of small bowel. Clinical correlation is necessary. No significant colonic stool stool. The colon is empty partially collapsed. Some liquid stool noted in descending colon. Bilateral distal ureter is unremarkable. The urinary bladder is unremarkable. No calcified calculi are noted within urinary bladder. The patient is status post hysterectomy. No destructive bony lesions are noted within pelvis. There is no inguinal adenopathy. IMPRESSION: 1. There is right nonobstructive nephrolithiasis. No hydronephrosis or hydroureter. Stable  mild dilatation of right extrarenal pelvis without hydronephrosis. 2. No calcified ureteral calculi. 3. Status postcholecystectomy.  Status post appendectomy. 4. Mild fluid distended small bowel loops within pelvis without definite transition point in caliber of small bowel. Mild ileus or enteritis cannot be excluded. Less likely early small bowel obstruction. 5. Unremarkable terminal ileum.  No pericecal inflammation. 6. Status post hysterectomy. Unremarkable bilateral distal ureters and urinary bladder. Electronically Signed   By: Natasha Mead M.D.   On: 05/23/2015 12:08   I have personally reviewed and evaluated these images and lab results as part of my medical decision-making.   EKG Interpretation None      MDM   Final diagnoses:  Abdominal pain  UTI (lower urinary tract infection)   Possible UTI v pyelonephritis, will tx accordingly. If workup negative, will consider CT scan to rule out stone. Many bacteria on urine,  Clinically has UTI, will tx. Has nephrolithiasis but no e/o obstructive ureterolithiasis. Will tx with keflex with close pcp follow up.     Marily Memos, MD 05/23/15 [phone]

## 2015-05-23 NOTE — ED Notes (Signed)
Patient d/c'd home. Questioning why she did not get pain meds. Explained that I would be willing to look into it and then patient started crying and was frustrated. Also looked back at last time she was here and showed her that she did not get narcotics. The patient states that she is frustrated and that she did not want to argue. I reiterated that I was willing to go ask. Patient left

## 2015-06-09 ENCOUNTER — Emergency Department (HOSPITAL_COMMUNITY)
Admission: EM | Admit: 2015-06-09 | Discharge: 2015-06-10 | Disposition: A | Discharge status: Home / Self Care | Payer: Self-pay

## 2015-06-09 DIAGNOSIS — F1410 Cocaine abuse, uncomplicated: Secondary | ICD-10-CM | POA: Insufficient documentation

## 2015-06-09 DIAGNOSIS — G8929 Other chronic pain: Secondary | ICD-10-CM | POA: Insufficient documentation

## 2015-06-09 DIAGNOSIS — F4310 Post-traumatic stress disorder, unspecified: Secondary | ICD-10-CM | POA: Insufficient documentation

## 2015-06-09 DIAGNOSIS — F331 Major depressive disorder, recurrent, moderate: Secondary | ICD-10-CM | POA: Insufficient documentation

## 2015-06-09 DIAGNOSIS — R45851 Suicidal ideations: Secondary | ICD-10-CM

## 2015-06-09 DIAGNOSIS — F1310 Sedative, hypnotic or anxiolytic abuse, uncomplicated: Secondary | ICD-10-CM | POA: Insufficient documentation

## 2015-06-09 DIAGNOSIS — F17210 Nicotine dependence, cigarettes, uncomplicated: Secondary | ICD-10-CM | POA: Insufficient documentation

## 2015-06-09 DIAGNOSIS — F1110 Opioid abuse, uncomplicated: Secondary | ICD-10-CM | POA: Insufficient documentation
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
## 2015-06-10 ENCOUNTER — Emergency Department (HOSPITAL_COMMUNITY): Payer: Self-pay

## 2015-06-10 DIAGNOSIS — F4310 Post-traumatic stress disorder, unspecified: Secondary | ICD-10-CM | POA: Diagnosis present

## 2015-06-10 DIAGNOSIS — F330 Major depressive disorder, recurrent, mild: Secondary | ICD-10-CM

## 2015-06-10 DIAGNOSIS — F331 Major depressive disorder, recurrent, moderate: Secondary | ICD-10-CM | POA: Diagnosis present

## 2015-06-10 LAB — COMPREHENSIVE METABOLIC PANEL
ALT: 91 U/L — AB (ref 14–54)
AST: 90 U/L — AB (ref 15–41)
Albumin: 4.2 g/dL (ref 3.5–5.0)
Alkaline Phosphatase: 112 U/L (ref 38–126)
Anion gap: 9 (ref 5–15)
BILIRUBIN TOTAL: 0.4 mg/dL (ref 0.3–1.2)
BUN: 8 mg/dL (ref 6–20)
CO2: 27 mmol/L (ref 22–32)
CREATININE: 0.74 mg/dL (ref 0.44–1.00)
Calcium: 9 mg/dL (ref 8.9–10.3)
Chloride: 102 mmol/L (ref 101–111)
GFR calc Af Amer: >60 mL/min (ref >60)
Glucose, Bld: 144 mg/dL — ABNORMAL HIGH (ref 65–99)
POTASSIUM: 4.1 mmol/L (ref 3.5–5.1)
Sodium: 138 mmol/L (ref 135–145)
TOTAL PROTEIN: 7.1 g/dL (ref 6.5–8.1)

## 2015-06-10 LAB — CBC
HCT: 41.2 % (ref 36.0–46.0)
Hemoglobin: 13.4 g/dL (ref 12.0–15.0)
MCH: 30.6 pg (ref 26.0–34.0)
MCHC: 32.5 g/dL (ref 30.0–36.0)
MCV: 94.1 fL (ref 78.0–100.0)
PLATELETS: 204 10*3/uL (ref 150–400)
RBC: 4.38 MIL/uL (ref 3.87–5.11)
RDW: 12.4 % (ref 11.5–15.5)
WBC: 5.8 10*3/uL (ref 4.0–10.5)

## 2015-06-10 LAB — ETHANOL

## 2015-06-10 LAB — ACETAMINOPHEN LEVEL: Acetaminophen (Tylenol), Serum: 12 ug/mL (ref 10–30)

## 2015-06-10 LAB — RAPID URINE DRUG SCREEN, HOSP PERFORMED
Amphetamines: NOT DETECTED
Barbiturates: NOT DETECTED
Benzodiazepines: POSITIVE — AB
Cocaine: POSITIVE — AB
Opiates: POSITIVE — AB
Tetrahydrocannabinol: NOT DETECTED

## 2015-06-10 LAB — SALICYLATE LEVEL: Salicylate Lvl: <4 mg/dL (ref 2.8–30.0)

## 2015-06-10 MED ORDER — NICOTINE 21 MG/24HR TD PT24
21.0000 mg | MEDICATED_PATCH | Freq: Every day | TRANSDERMAL | Status: DC
  Administered 2015-06-10: 21 mg via TRANSDERMAL
  Filled 2015-06-10: qty 1

## 2015-06-10 MED ORDER — LOPERAMIDE HCL 2 MG PO CAPS: 2.0000 mg | ORAL_CAPSULE | ORAL | Status: DC | PRN

## 2015-06-10 MED ORDER — FLUOXETINE HCL 20 MG PO CAPS: 20.0000 mg | ORAL_CAPSULE | Freq: Every day | ORAL | Status: AC

## 2015-06-10 MED ORDER — ACETAMINOPHEN 325 MG PO TABS
650.0000 mg | ORAL_TABLET | ORAL | Status: DC | PRN
  Administered 2015-06-10: 650 mg via ORAL
  Filled 2015-06-10: qty 2

## 2015-06-10 MED ORDER — CLONIDINE HCL 0.1 MG PO TABS: 0.1000 mg | ORAL_TABLET | ORAL | Status: DC

## 2015-06-10 MED ORDER — NAPROXEN 500 MG PO TABS: 500.0000 mg | ORAL_TABLET | Freq: Two times a day (BID) | ORAL | Status: DC | PRN

## 2015-06-10 MED ORDER — LORAZEPAM 1 MG PO TABS
1.0000 mg | ORAL_TABLET | Freq: Once | ORAL | Status: AC
  Administered 2015-06-10: 1 mg via ORAL

## 2015-06-10 MED ORDER — HYDROXYZINE HCL 25 MG PO TABS: 25.0000 mg | ORAL_TABLET | Freq: Four times a day (QID) | ORAL | Status: DC | PRN

## 2015-06-10 MED ORDER — METHOCARBAMOL 500 MG PO TABS: 500.0000 mg | ORAL_TABLET | Freq: Three times a day (TID) | ORAL | Status: DC | PRN

## 2015-06-10 MED ORDER — QUETIAPINE FUMARATE 100 MG PO TABS: 100.0000 mg | ORAL_TABLET | Freq: Every day | ORAL | Status: AC

## 2015-06-10 MED ORDER — ONDANSETRON 4 MG PO TBDP: 4.0000 mg | ORAL_TABLET | Freq: Four times a day (QID) | ORAL | Status: DC | PRN

## 2015-06-10 MED ORDER — PRAZOSIN HCL 2 MG PO CAPS: 4.0000 mg | ORAL_CAPSULE | Freq: Every day | ORAL | Status: AC

## 2015-06-10 MED ORDER — DICYCLOMINE HCL 20 MG PO TABS: 20.0000 mg | ORAL_TABLET | Freq: Four times a day (QID) | ORAL | Status: DC | PRN

## 2015-06-10 MED ORDER — LORAZEPAM 1 MG PO TABS
1.0000 mg | ORAL_TABLET | Freq: Three times a day (TID) | ORAL | Status: DC | PRN
  Administered 2015-06-10: 1 mg via ORAL
  Filled 2015-06-10 (×2): qty 1

## 2015-06-10 MED ORDER — FLUOXETINE HCL 20 MG PO CAPS
20.0000 mg | ORAL_CAPSULE | Freq: Every day | ORAL | Status: DC
  Administered 2015-06-10: 20 mg via ORAL
  Filled 2015-06-10: qty 1

## 2015-06-10 MED ORDER — IBUPROFEN 200 MG PO TABS
600.0000 mg | ORAL_TABLET | Freq: Once | ORAL | Status: AC
  Administered 2015-06-10: 600 mg via ORAL
  Filled 2015-06-10: qty 3

## 2015-06-10 MED ORDER — CLONIDINE HCL 0.1 MG PO TABS
0.1000 mg | ORAL_TABLET | Freq: Four times a day (QID) | ORAL | Status: DC
  Administered 2015-06-10 (×2): 0.1 mg via ORAL
  Filled 2015-06-10 (×2): qty 1

## 2015-06-10 MED ORDER — QUETIAPINE FUMARATE 100 MG PO TABS: 100.0000 mg | ORAL_TABLET | Freq: Every day | ORAL | Status: DC

## 2015-06-10 MED ORDER — CLONIDINE HCL 0.1 MG PO TABS
0.1000 mg | ORAL_TABLET | Freq: Every day | ORAL | Status: DC
Start: 2015-06-14 — End: 2015-06-10

## 2015-06-10 MED ORDER — PRAZOSIN HCL 2 MG PO CAPS
4.0000 mg | ORAL_CAPSULE | Freq: Every day | ORAL | Status: DC
  Filled 2015-06-10: qty 2
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
## 2015-06-10 NOTE — ED Notes (Signed)
Pt is A&O x3, is not suicidal, homicidal, delusional. Denies AVH. Discharged with her daughter.

## 2015-06-10 NOTE — ED Notes (Signed)
Pt has a valuables envelope locked up with security

## 2015-06-10 NOTE — ED Notes (Signed)
Bed: [facility]37 Expected date:  Expected time:  Means of arrival:  Comments: Triage 3  

## 2015-06-10 NOTE — ED Notes (Signed)
Pt admitted to the unit from the ED. Currently she denies SI or HI. She wants to be discharged and is anxious to be discharged as soon as possible. She stands in the hall with her arms crossed, looking at the activity in the nurses's station.

## 2015-06-10 NOTE — ED Notes (Signed)
Pt's belongings are in locker 27 and 28.
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
## 2015-06-10 NOTE — BH Assessment (Signed)
[facility] Assessment Progress Note  Per Nanine Means, NP, this pt does not require psychiatric hospitalization at this time.  Pt is to be discharged from [facility] with outpatient behavioral health referrals.  Discharge instructions advise pt to follow up with [facility] in [place], [facility] in [place], or [facility] in [place].  Pt's nurse, Diane, has been notified.  Doylene Canning, MA Triage Specialist [phone]

## 2015-06-10 NOTE — Discharge Instructions (Signed)
For your ongoing mental health needs, you are advised to follow up with one of the following providers:. [facility]  [address] [place], [place] [address]  [phone]       [facility]      [address] [place] [address]      [place], [place] [address]      [phone]       New and returning patients are seen at their walk-in clinic.  Walk-in hours are Monday - Friday from 8:00 am - 3:00 pm.  Walk-in patients are seen on a first come, first served basis.  Try to arrive as early as possible for he best chance of being seen the same day.       [facility]      [address]      [place], [place] [address]       [phone]

## 2015-06-10 NOTE — BH Assessment (Signed)
Consulted with Christen Bame, NP , pt meets criteria for inpatient for stabilization. Per Joseph Berkshire, RN at [facility] nurse, no current appropriate bed available [300 hall] for pt. Awaiting a.m. Review for availability. Jammi Morrissette K. [name], [facility]  Counselor 06/10/2015 2:50 AM
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
## 2015-06-10 NOTE — BH Assessment (Signed)
Contacted [facility] Triage Charge Nurse, Kendal Hymen at [phone] on 06/10/15,  will get cart ready for tele-psych. Trigo Winterbottom K. Tiburcio Pea  LPC-A, [facility]  Counselor 06/10/2015 1:25 AM
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
## 2015-06-10 NOTE — [facility] Counselor (Signed)
This Clinical research associate spoke with Beth Best long Arts administrator , RN and notified of pt. Disposition and pt. Request for info on directive. Beth Best K. [name], [facility]  Counselor 06/10/2015 2:52 AM

## 2015-06-10 NOTE — [facility] Suicide Risk Assessment (Signed)
Suicide Risk Assessment  Discharge Assessment   [facility] Discharge Suicide Risk Assessment   Principal Problem: PTSD (post-traumatic stress disorder) Discharge Diagnoses:  Patient Active Problem List   Diagnosis Date Noted  . Major depressive disorder, recurrent episode, moderate (HCC) [F33.1] 06/10/2015    Priority: High  . PTSD (post-traumatic stress disorder) [F43.10] 06/10/2015    Priority: High    Total Time spent with patient: 45 minutes  Musculoskeletal: Strength & Muscle Tone: within normal limits Gait & Station: normal Patient leans: N/A  Psychiatric Specialty Exam: Review of Systems  Constitutional: Negative.   HENT: Negative.   Eyes: Negative.   Respiratory: Negative.   Cardiovascular: Negative.   Gastrointestinal: Negative.   Genitourinary: Negative.   Musculoskeletal: Negative.   Skin: Negative.   Neurological: Negative.   Endo/Heme/Allergies: Negative.   Psychiatric/Behavioral: Positive for depression and substance abuse.    Blood pressure 125/85, pulse 82, temperature 98.4 F (36.9 C), temperature source Oral, resp. rate 16, height  (1.676 m), weight 58.968 kg (130 lb), SpO2 93 %.Body mass index is 20.99 kg/(m^2).  General Appearance: Disheveled  Eye Solicitor::  Fair  Speech:  Clear and Coherent  Volume:  Normal  Mood:  Depressed  Affect:  Congruent  Thought Process:  Goal Directed, Linear and Logical  Orientation:  Full (Time, Place, and Person)  Thought Content:  WDL  Suicidal Thoughts:  No with no intent/plan  Homicidal Thoughts:  No  Memory:  Immediate;   Fair Recent;   Fair Remote;   Fair  Judgement:  Poor  Insight:  Lacking  Psychomotor Activity:  Normal  Concentration:  Fair  Recall:  [employer] of Knowledge:Fair  Language: Fair  Akathisia:  NA  Handed:  Right  AIMS (if indicated):     Assets:  Communication Skills Desire for Improvement Housing Physical Health Resilience  ADL's:  Intact  Cognition: WNL  Sleep:      Mental  Status Per Nursing Assessment::   On Admission:   depression, substance abuse  Demographic Factors:  Caucasian  Loss Factors: NA  Historical Factors: NA  Risk Reduction Factors:   Responsible for children under 7 years of age, Sense of responsibility to family, Living with another person, especially a relative and Positive social support  Continued Clinical Symptoms:  Depression, mild  Cognitive Features That Contribute To Risk:  None    Suicide Risk:  Minimal: No identifiable suicidal ideation.  Patients presenting with no risk factors but with morbid ruminations; may be classified as minimal risk based on the severity of the depressive symptoms    Plan Of Care/Follow-up recommendations:  Activity:  as tolerated  Diet:  heart healthy diet  LORD, JAMISON, NP 06/10/2015, 2:21 PM

## 2015-07-23 ENCOUNTER — Ambulatory Visit (INDEPENDENT_AMBULATORY_CARE_PROVIDER_SITE_OTHER): Payer: BLUE CROSS/BLUE SHIELD | Admitting: Family Medicine

## 2015-07-23 VITALS — BP 120/76 | HR 62 | Temp 99.1°F | Resp 20 | Ht 66.0 in | Wt 143.4 lb

## 2015-07-23 DIAGNOSIS — J22 Unspecified acute lower respiratory infection: Secondary | ICD-10-CM

## 2015-07-23 DIAGNOSIS — J988 Other specified respiratory disorders: Secondary | ICD-10-CM | POA: Diagnosis not present

## 2015-07-23 DIAGNOSIS — R059 Cough, unspecified: Secondary | ICD-10-CM

## 2015-07-23 DIAGNOSIS — G4700 Insomnia, unspecified: Secondary | ICD-10-CM

## 2015-07-23 DIAGNOSIS — F4310 Post-traumatic stress disorder, unspecified: Secondary | ICD-10-CM | POA: Diagnosis not present

## 2015-07-23 DIAGNOSIS — J9801 Acute bronchospasm: Secondary | ICD-10-CM | POA: Diagnosis not present

## 2015-07-23 DIAGNOSIS — F418 Other specified anxiety disorders: Secondary | ICD-10-CM

## 2015-07-23 DIAGNOSIS — R05 Cough: Secondary | ICD-10-CM | POA: Diagnosis not present

## 2015-07-23 MED ORDER — HYDROXYZINE HCL 25 MG PO TABS: 25.0000 mg | ORAL_TABLET | Freq: Four times a day (QID) | ORAL | Status: AC | PRN

## 2015-07-23 MED ORDER — DOXYCYCLINE HYCLATE 100 MG PO TABS
100.0000 mg | ORAL_TABLET | Freq: Two times a day (BID) | ORAL | Status: DC
Start: 2015-07-23 — End: 2021-08-14
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
## 2015-07-23 NOTE — Patient Instructions (Addendum)
[facility] ph# [phone], they have multiple locations.  For your cough, can start doxycycline to cover for possible bronchitis. (based on your other medications, azithromycin has a possible interaction). Mucinex or Mucinex DM as needed for cough, but stopped these if increased wheezing. If you are wheezing, can use albuterol as previously prescribed, but if using that medication more than 2-3 times per day, or more than 2-3 days persistently, recommend recheck here or emergency room.  I did refer you to psychiatry as requested.  I did refill the Vistaril as was prescribed by  Psychiatry in February, and your other psychiatric medication should still have refills.  If the Vistaril does not help with her sleep tonight, recommend he be evaluated through the emergency room or Behavioral Health based on the timing of your insomnia. Return to the clinic or go to the nearest emergency room if any of your symptoms worsen or new symptoms occur.   Cough, Adult Coughing is a reflex that clears your throat and your airways. Coughing helps to heal and protect your lungs. It is normal to cough occasionally, but a cough that happens with other symptoms or lasts a long time may be a sign of a condition that needs treatment. A cough may last only 2-3 weeks (acute), or it may last longer than 8 weeks (chronic). CAUSES Coughing is commonly caused by:  Breathing in substances that irritate your lungs.  A viral or bacterial respiratory infection.  Allergies.  Asthma.  Postnasal drip.  Smoking.  Acid backing up from the stomach into the esophagus (gastroesophageal reflux).  Certain medicines.  Chronic lung problems, including COPD (or rarely, lung cancer).  Other medical conditions such as heart failure. HOME CARE INSTRUCTIONS  Pay attention to any changes in your symptoms. Take these actions to help with your discomfort:  Take medicines only as told by your health care provider.  If  you were prescribed an antibiotic medicine, take it as told by your health care provider. Do not stop taking the antibiotic even if you start to feel better.  Talk with your health care provider before you take a cough suppressant medicine.  Drink enough fluid to keep your urine clear or pale yellow.  If the air is dry, use a cold steam vaporizer or humidifier in your bedroom or your home to help loosen secretions.  Avoid anything that causes you to cough at work or at home.  If your cough is worse at night, try sleeping in a semi-upright position.  Avoid cigarette smoke. If you smoke, quit smoking. If you need help quitting, ask your health care provider.  Avoid caffeine.  Avoid alcohol.  Rest as needed. SEEK MEDICAL CARE IF:   You have new symptoms.  You cough up pus.  Your cough does not get better after 2-3 weeks, or your cough gets worse.  You cannot control your cough with suppressant medicines and you are losing sleep.  You develop pain that is getting worse or pain that is not controlled with pain medicines.  You have a fever.  You have unexplained weight loss.  You have night sweats. SEEK IMMEDIATE MEDICAL CARE IF:  You cough up blood.  You have difficulty breathing.  Your heartbeat is very fast.   This information is not intended to replace advice given to you by your health care provider. Make sure you discuss any questions you have with your health care provider.   Document Released: 10/17/2010 Document Revised: 01/09/2015 Document Reviewed: 06/27/2014 Elsevier  Interactive Patient Education 2016 Elsevier Inc.   Acute Bronchitis Bronchitis is inflammation of the airways that extend from the windpipe into the lungs (bronchi). The inflammation often causes mucus to develop. This leads to a cough, which is the most common symptom of bronchitis.  In acute bronchitis, the condition usually develops suddenly and goes away over time, usually in a couple weeks.  Smoking, allergies, and asthma can make bronchitis worse. Repeated episodes of bronchitis may cause further lung problems.  CAUSES Acute bronchitis is most often caused by the same virus that causes a cold. The virus can spread from person to person (contagious) through coughing, sneezing, and touching contaminated objects. SIGNS AND SYMPTOMS   Cough.   Fever.   Coughing up mucus.   Body aches.   Chest congestion.   Chills.   Shortness of breath.   Sore throat.  DIAGNOSIS  Acute bronchitis is usually diagnosed through a physical exam. Your health care provider will also ask you questions about your medical history. Tests, such as chest X-rays, are sometimes done to rule out other conditions.  TREATMENT  Acute bronchitis usually goes away in a couple weeks. Oftentimes, no medical treatment is necessary. Medicines are sometimes given for relief of fever or cough. Antibiotic medicines are usually not needed but may be prescribed in certain situations. In some cases, an inhaler may be recommended to help reduce shortness of breath and control the cough. A cool mist vaporizer may also be used to help thin bronchial secretions and make it easier to clear the chest.  HOME CARE INSTRUCTIONS  Get plenty of rest.   Drink enough fluids to keep your urine clear or pale yellow (unless you have a medical condition that requires fluid restriction). Increasing fluids may help thin your respiratory secretions (sputum) and reduce chest congestion, and it will prevent dehydration.   Take medicines only as directed by your health care provider.  If you were prescribed an antibiotic medicine, finish it all even if you start to feel better.  Avoid smoking and secondhand smoke. Exposure to cigarette smoke or irritating chemicals will make bronchitis worse. If you are a smoker, consider using nicotine gum or skin patches to help control withdrawal symptoms. Quitting smoking will help your lungs  heal faster.   Reduce the chances of another bout of acute bronchitis by washing your hands frequently, avoiding people with cold symptoms, and trying not to touch your hands to your mouth, nose, or eyes.   Keep all follow-up visits as directed by your health care provider.  SEEK MEDICAL CARE IF: Your symptoms do not improve after 1 week of treatment.  SEEK IMMEDIATE MEDICAL CARE IF:  You develop an increased fever or chills.   You have chest pain.   You have severe shortness of breath.  You have bloody sputum.   You develop dehydration.  You faint or repeatedly feel like you are going to pass out.  You develop repeated vomiting.  You develop a severe headache. MAKE SURE YOU:   Understand these instructions.  Will watch your condition.  Will get help right away if you are not doing well or get worse.   This information is not intended to replace advice given to you by your health care provider. Make sure you discuss any questions you have with your health care provider.   Document Released: 05/28/2004 Document Revised: 05/11/2014 Document Reviewed: 10/11/2012 Elsevier Interactive Patient Education 2016 Elsevier Inc.  Insomnia Insomnia is a sleep disorder that makes it  difficult to fall asleep or to stay asleep. Insomnia can cause tiredness (fatigue), low energy, difficulty concentrating, mood swings, and poor performance at work or school.  There are three different ways to classify insomnia:  Difficulty falling asleep.  Difficulty staying asleep.  Waking up too early in the morning. Any type of insomnia can be long-term (chronic) or short-term (acute). Both are common. Short-term insomnia usually lasts for three months or less. Chronic insomnia occurs at least three times a week for longer than three months. CAUSES  Insomnia may be caused by another condition, situation, or substance, such as:  Anxiety.  Certain medicines.  Gastroesophageal reflux  disease (GERD) or other gastrointestinal conditions.  Asthma or other breathing conditions.  Restless legs syndrome, sleep apnea, or other sleep disorders.  Chronic pain.  Menopause. This may include hot flashes.  Stroke.  Abuse of alcohol, tobacco, or illegal drugs.  Depression.  Caffeine.   Neurological disorders, such as Alzheimer disease.  An overactive thyroid (hyperthyroidism). The cause of insomnia may not be known. RISK FACTORS Risk factors for insomnia include:  Gender. Women are more commonly affected than men.  Age. Insomnia is more common as you get older.  Stress. This may involve your professional or personal life.  Income. Insomnia is more common in people with lower income.  Lack of exercise.   Irregular work schedule or night shifts.  Traveling between different time zones. SIGNS AND SYMPTOMS If you have insomnia, trouble falling asleep or trouble staying asleep is the main symptom. This may lead to other symptoms, such as:  Feeling fatigued.  Feeling nervous about going to sleep.  Not feeling rested in the morning.  Having trouble concentrating.  Feeling irritable, anxious, or depressed. TREATMENT  Treatment for insomnia depends on the cause. If your insomnia is caused by an underlying condition, treatment will focus on addressing the condition. Treatment may also include:   Medicines to help you sleep.  Counseling or therapy.  Lifestyle adjustments. HOME CARE INSTRUCTIONS   Take medicines only as directed by your health care provider.  Keep regular sleeping and waking hours. Avoid naps.  Keep a sleep diary to help you and your health care provider figure out what could be causing your insomnia. Include:   When you sleep.  When you wake up during the night.  How well you sleep.   How rested you feel the next day.  Any side effects of medicines you are taking.  What you eat and drink.   Make your bedroom a comfortable  place where it is easy to fall asleep:  Put up shades or special blackout curtains to block light from outside.  Use a white noise machine to block noise.  Keep the temperature cool.   Exercise regularly as directed by your health care provider. Avoid exercising right before bedtime.  Use relaxation techniques to manage stress. Ask your health care provider to suggest some techniques that may work well for you. These may include:  Breathing exercises.  Routines to release muscle tension.  Visualizing peaceful scenes.  Cut back on alcohol, caffeinated beverages, and cigarettes, especially close to bedtime. These can disrupt your sleep.  Do not overeat or eat spicy foods right before bedtime. This can lead to digestive discomfort that can make it hard for you to sleep.  Limit screen use before bedtime. This includes:  Watching TV.  Using your smartphone, tablet, and computer.  Stick to a routine. This can help you fall asleep faster. Try to  do a quiet activity, brush your teeth, and go to bed at the same time each night.  Get out of bed if you are still awake after 15 minutes of trying to sleep. Keep the lights down, but try reading or doing a quiet activity. When you feel sleepy, go back to bed.  Make sure that you drive carefully. Avoid driving if you feel very sleepy.  Keep all follow-up appointments as directed by your health care provider. This is important. SEEK MEDICAL CARE IF:   You are tired throughout the day or have trouble in your daily routine due to sleepiness.  You continue to have sleep problems or your sleep problems get worse. SEEK IMMEDIATE MEDICAL CARE IF:   You have serious thoughts about hurting yourself or someone else.   This information is not intended to replace advice given to you by your health care provider. Make sure you discuss any questions you have with your health care provider.   Document Released: 04/17/2000 Document Revised: 01/09/2015  Document Reviewed: 01/19/2014 Elsevier Interactive Patient Education [employer]

## 2015-10-03 ENCOUNTER — Ambulatory Visit (HOSPITAL_COMMUNITY): Payer: Self-pay | Admitting: Psychiatry

## 2016-05-16 IMAGING — CT CT HEAD W/O CM
1 series · 16 of 30 positions shown, 20 images · non-contrast
Comparison: CT of the head August 05, 2012

CLINICAL DATA: Daily headache for 2 months.

EXAM:
CT HEAD WITHOUT CONTRAST
TECHNIQUE: Contiguous axial images were obtained from the base of the skull
through the vertex without intravenous contrast.

[Series 2: head 4.8 h37s · axial · 0.45mm/px · z∈[+1068,+1205]mm · 16 of 32 slices shown, 20 images]
[im 2/32  brain]
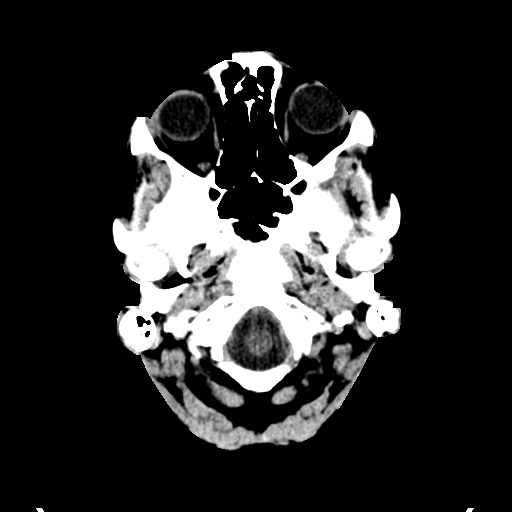
[im 2/32  bone]
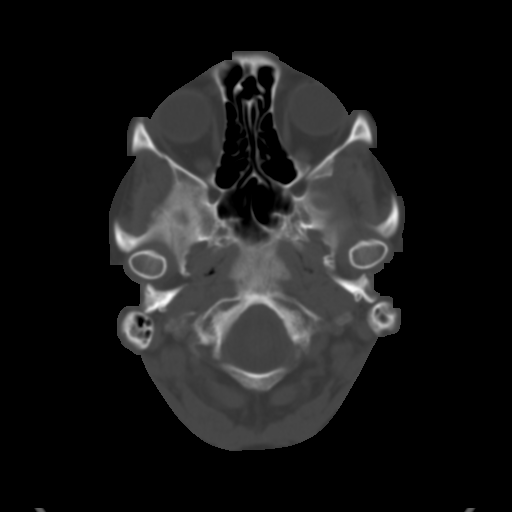
[im 4/32  brain]
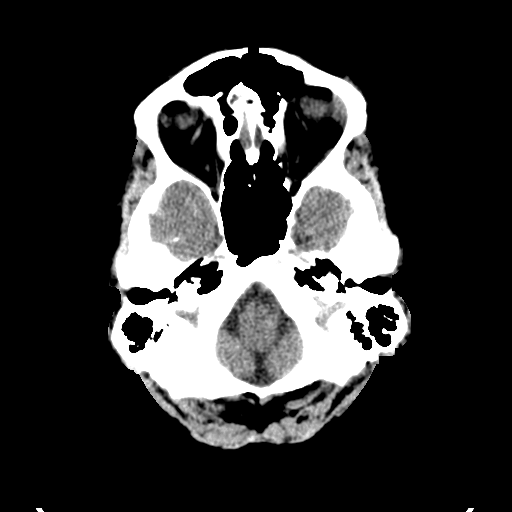
[im 6/32  brain]
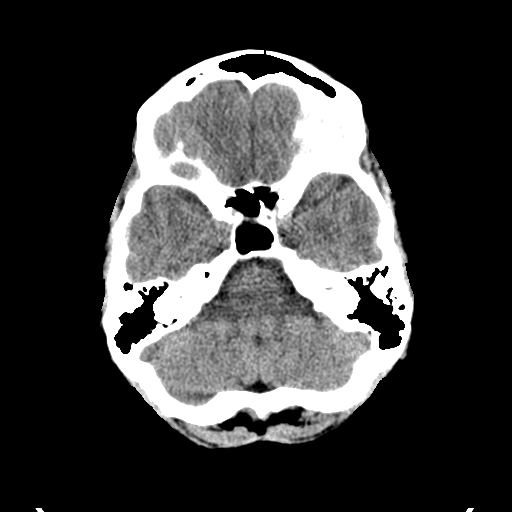
[im 8/32  brain]
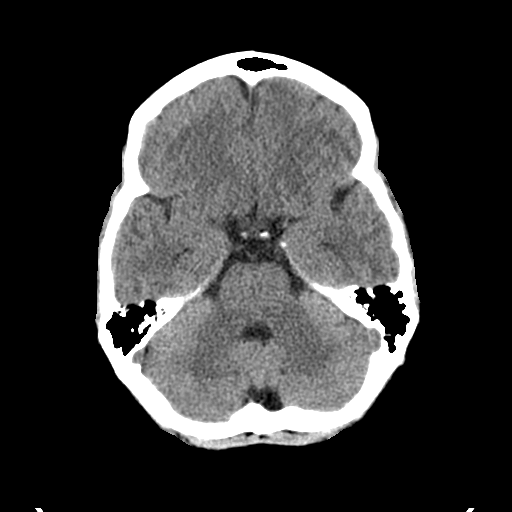
[im 9/32  brain]
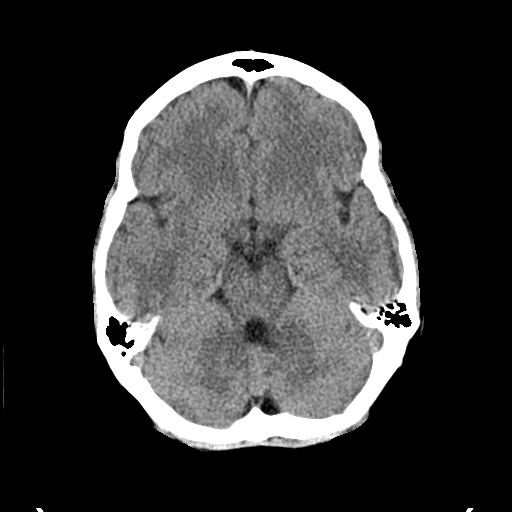
[im 9/32  bone]
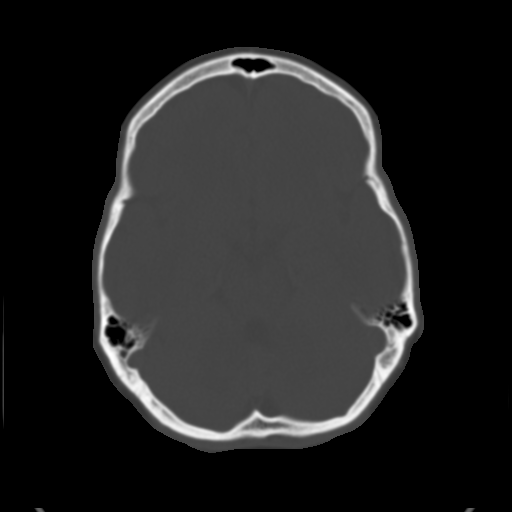
[im 11/32  brain]
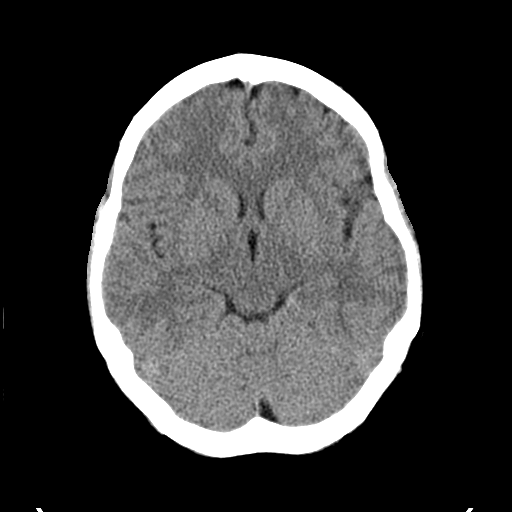
[im 13/32  brain]
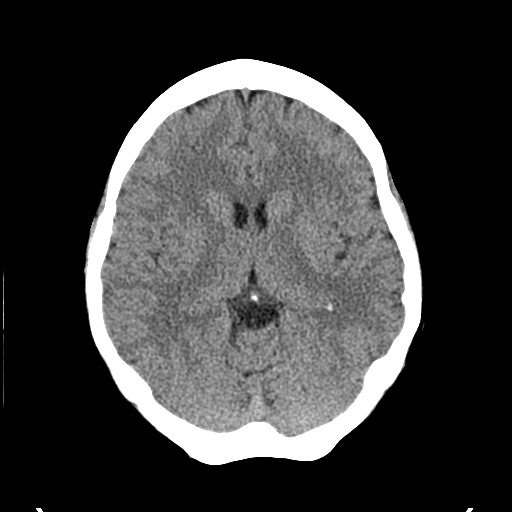
[im 15/32  brain]
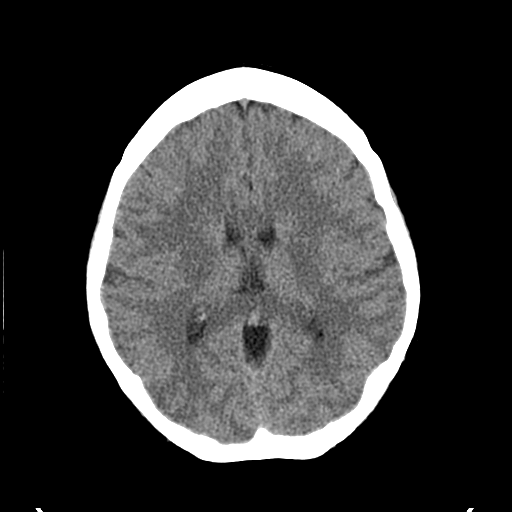
[im 17/32  brain]
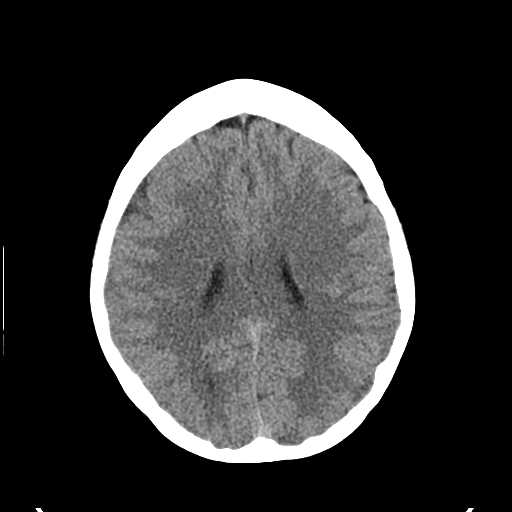
[im 17/32  bone]
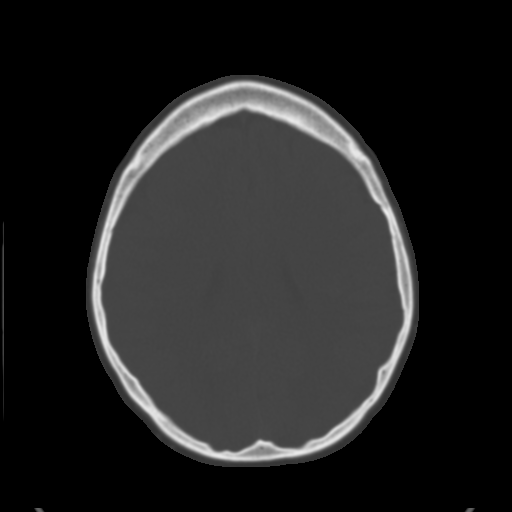
[im 19/32  brain]
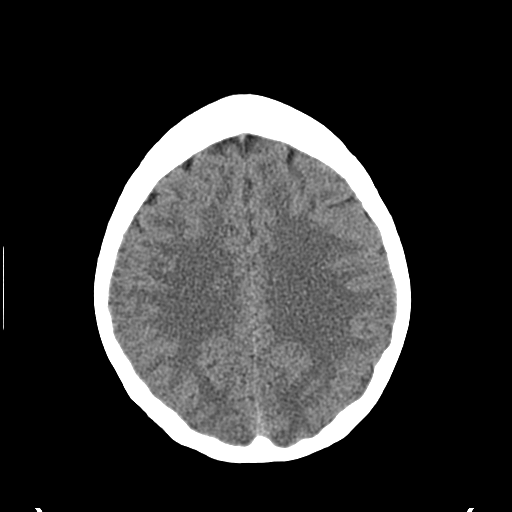
[im 21/32  brain]
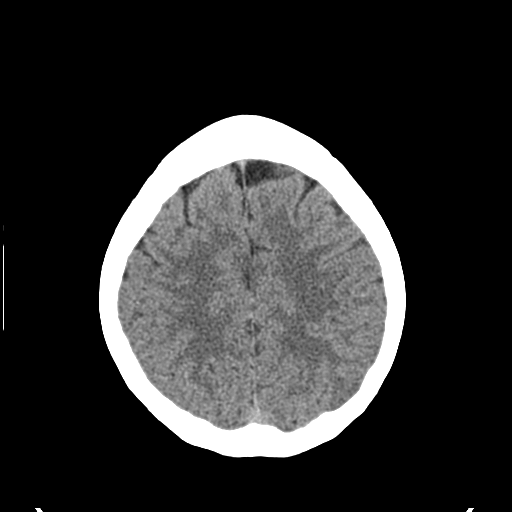
[im 23/32  brain]
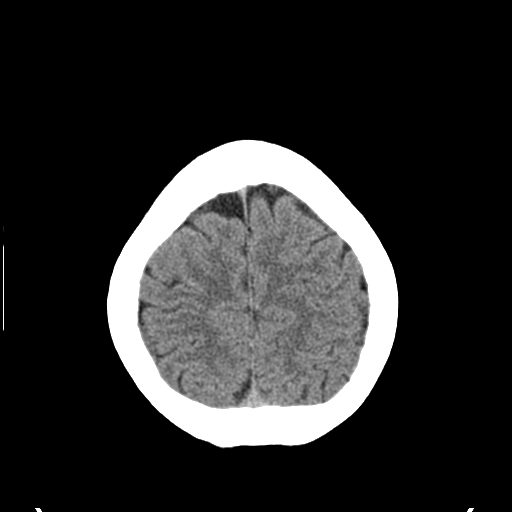
[im 24/32  brain]
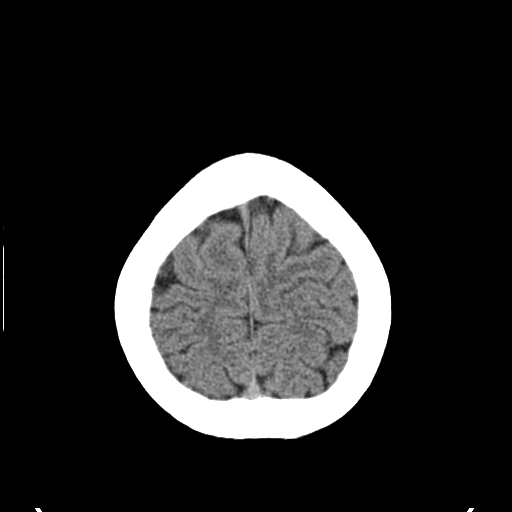
[im 24/32  bone]
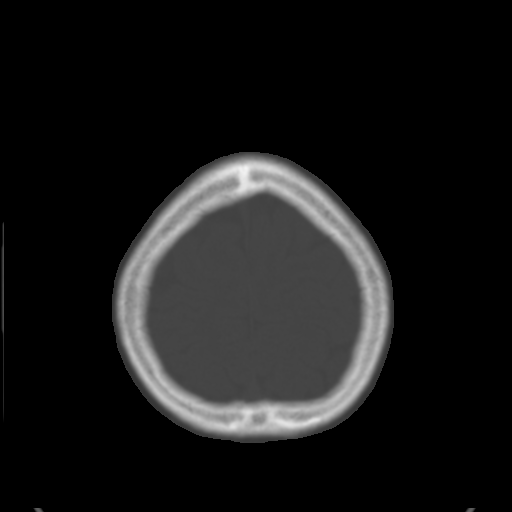
[im 26/32  brain]
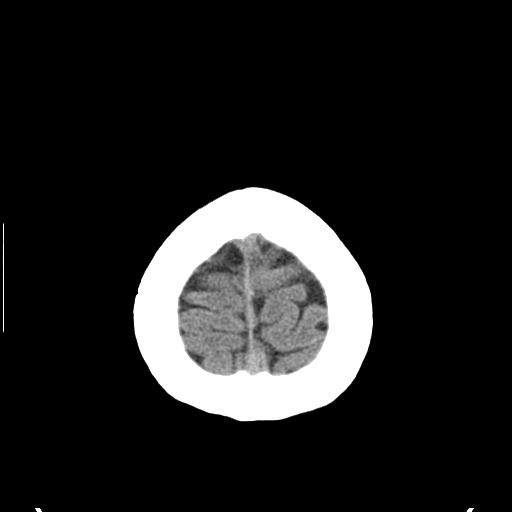
[im 28/32  brain]
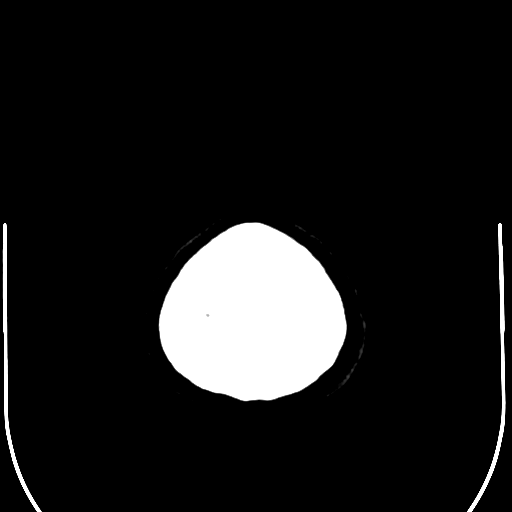
[im 30/32  brain]
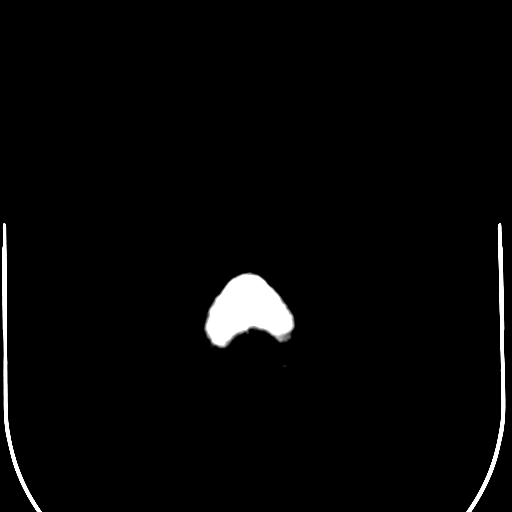

[16 of 30 positions shown; findings below may reference images not displayed]

FINDINGS: The ventricles and sulci are normal. No intraparenchymal hemorrhage,
mass effect nor midline shift. No acute large vascular territory
infarcts.

No abnormal extra-axial fluid collections. Basal cisterns are
patent.

No skull fracture. The included ocular globes and orbital contents
are non-suspicious. The mastoid aircells and included paranasal
sinuses are well-aerated.
IMPRESSION: No acute intracranial process.  Normal noncontrast CT of the head.

  By: Sondous Rouiha

## 2017-04-07 IMAGING — DX DG FOOT COMPLETE 3+V*L*
3 series · 3 of 3 positions shown · non-contrast
Comparison: 12/10/2014

CLINICAL DATA: Fall

EXAM:
LEFT FOOT - COMPLETE 3+ VIEW

[foot ap]
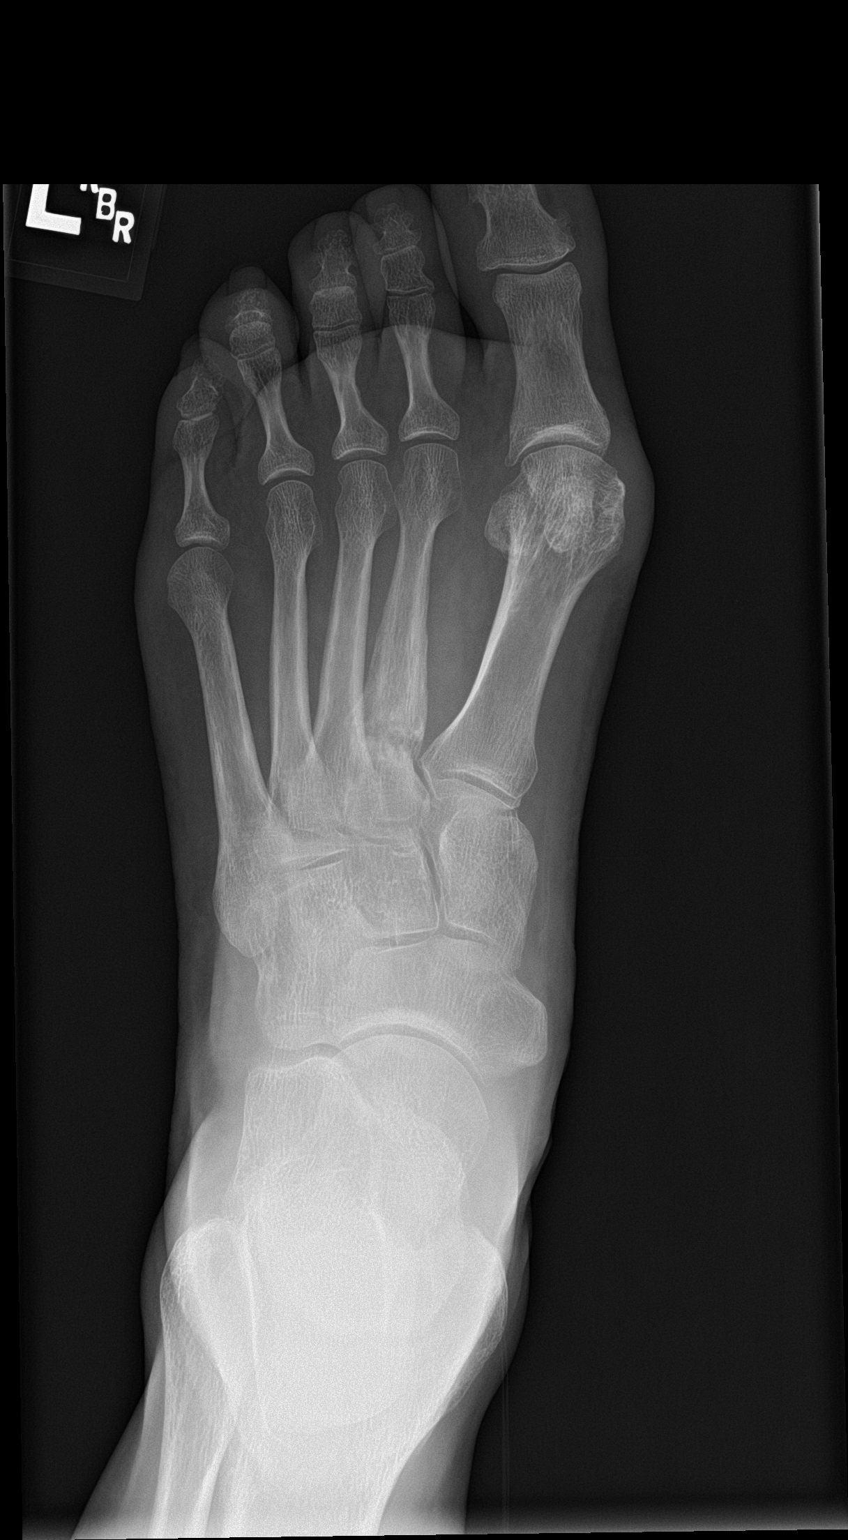

[foot obl]
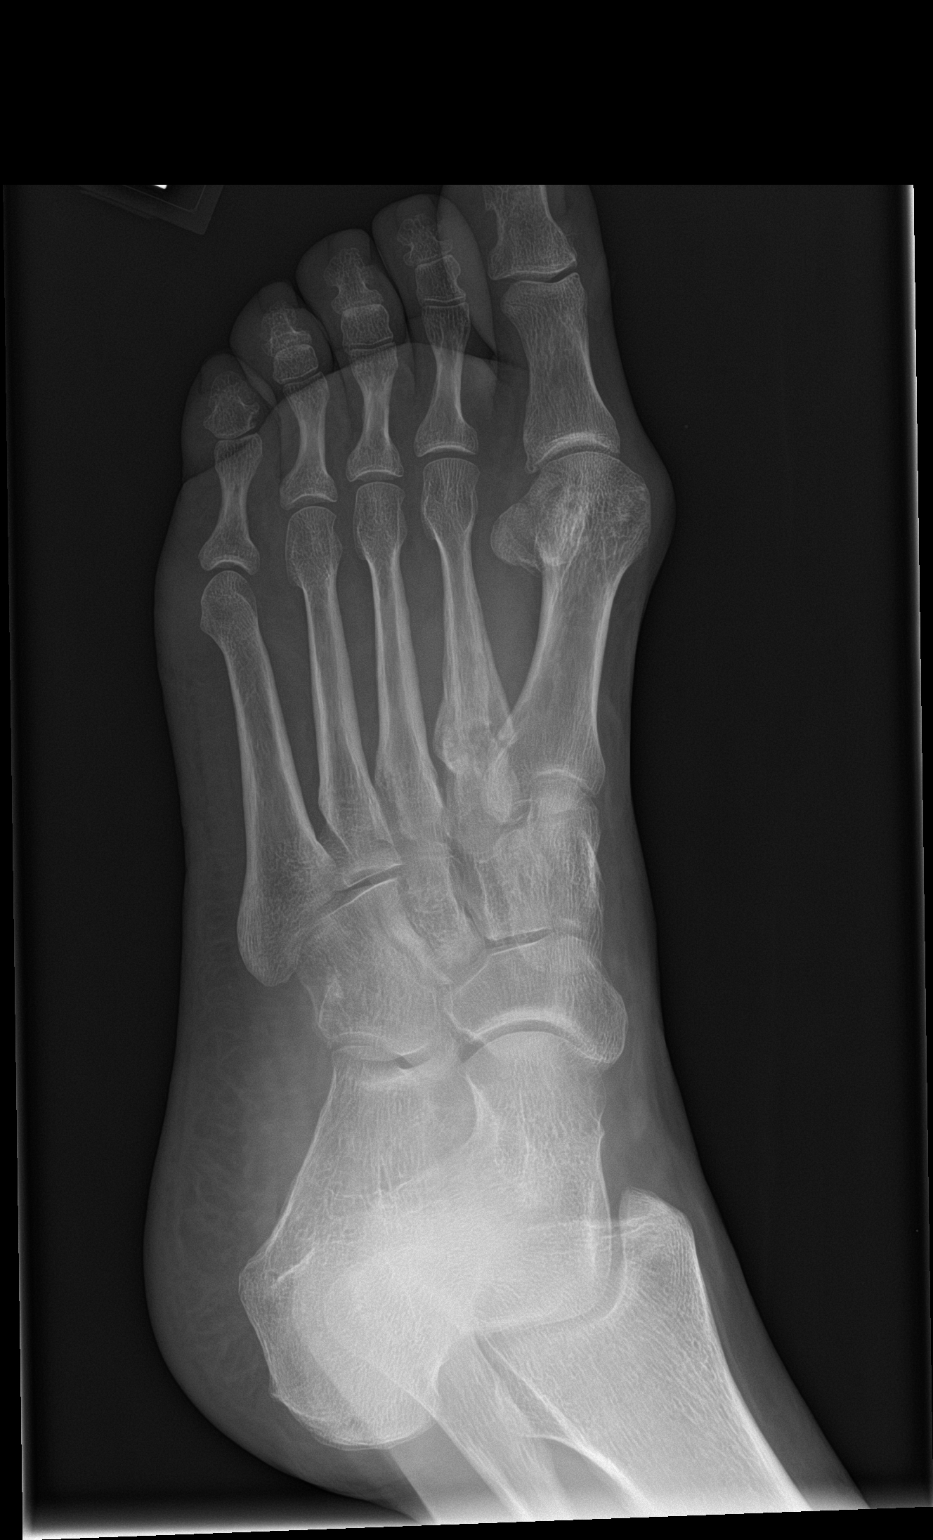

[foot lat]
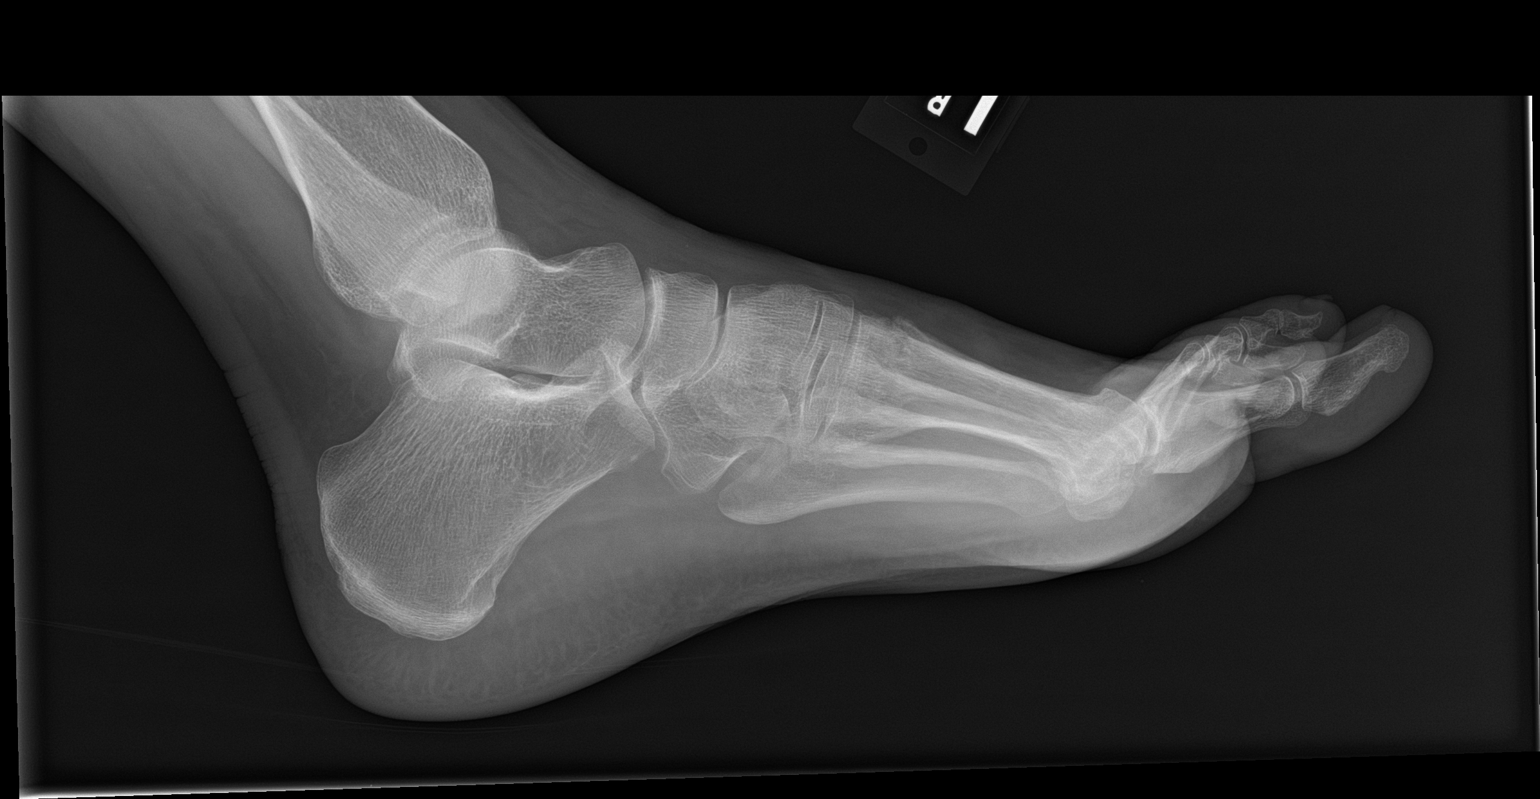

[3 of 3 positions shown; findings below may reference images not displayed]

FINDINGS: There is extensive callus formation about the proximal second
metatarsal compatible with healed fracture and deformity. No acute
fracture or dislocation. Unremarkable soft tissues. Mild hallux
valgus. Degenerative change at the first metatarsophalangeal joint.
IMPRESSION: No acute bony pathology.  Chronic changes.

## 2018-02-13 ENCOUNTER — Emergency Department (HOSPITAL_BASED_OUTPATIENT_CLINIC_OR_DEPARTMENT_OTHER)
Admission: EM | Admit: 2018-02-13 | Discharge: 2018-02-14 | Disposition: A | Discharge status: Home / Self Care | Payer: Medicare Other | Attending: Emergency Medicine | Admitting: Emergency Medicine

## 2018-02-13 ENCOUNTER — Other Ambulatory Visit: Payer: Self-pay

## 2018-02-13 ENCOUNTER — Encounter (HOSPITAL_BASED_OUTPATIENT_CLINIC_OR_DEPARTMENT_OTHER): Payer: Self-pay | Admitting: Adult Health

## 2018-02-13 DIAGNOSIS — F17210 Nicotine dependence, cigarettes, uncomplicated: Secondary | ICD-10-CM | POA: Insufficient documentation

## 2018-02-13 DIAGNOSIS — R21 Rash and other nonspecific skin eruption: Secondary | ICD-10-CM

## 2018-02-13 DIAGNOSIS — Z79899 Other long term (current) drug therapy: Secondary | ICD-10-CM | POA: Insufficient documentation

## 2018-02-13 DIAGNOSIS — F319 Bipolar disorder, unspecified: Secondary | ICD-10-CM | POA: Insufficient documentation

## 2018-02-13 HISTORY — DX: Opioid dependence, uncomplicated: F11.20

## 2018-02-13 MED ORDER — SULFAMETHOXAZOLE-TRIMETHOPRIM 800-160 MG PO TABS: 1.0000 | ORAL_TABLET | Freq: Two times a day (BID) | ORAL | 0 refills | Status: AC

## 2018-02-13 MED ORDER — HYDROXYZINE HCL 25 MG PO TABS: 25.0000 mg | ORAL_TABLET | Freq: Four times a day (QID) | ORAL | 0 refills | Status: AC

## 2018-02-13 MED ORDER — HYDROXYZINE HCL 25 MG PO TABS
25.0000 mg | ORAL_TABLET | Freq: Once | ORAL | Status: AC
  Administered 2018-02-13: 25 mg via ORAL
  Filled 2018-02-13: qty 1

## 2018-02-13 MED ORDER — CHLORHEXIDINE GLUCONATE 4 % EX LIQD: Freq: Every day | CUTANEOUS | 0 refills | Status: AC | PRN

## 2018-02-13 NOTE — ED Triage Notes (Addendum)
Presents with pustules to left arm and open small ulcerated areas to left arm.  , they began a few days ago. 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

## 2018-02-13 NOTE — ED Notes (Signed)
ED Provider at bedside. 

## 2018-02-13 NOTE — ED Provider Notes (Signed)
[facility] EMERGENCY DEPARTMENT Provider Note   CSN: [id number] Arrival date & time: 02/13/18  2142     History   Chief Complaint Chief Complaint  Patient presents with  . Skin Ulcer    HPI Beth Best is a 42 y.o. female.  HPI  This a 42 year old female with a history of bipolar disorder and history of IV drug use although she states she has been clean for over 1 year who presents with a rash to the left arm.  Patient reports that she has had a tattoo being worked on on her left arm.  She last had worked on 5 to 6 days ago.  After that she noted some red spots over her left arm.  There is somewhat itchy but also painful.  No significant redness or fever.  She is also noted one spot on her face.  No other lesions over her trunk or legs.  No one in the house has similar lesions.  No recent travel to hotels.  Patient reports that she has one spot over her left medial arm that is very tender to touch and painful.  Rates her pain at 5 out of 10.  She does report that she has scratched the lesions and try to "pop" them.  Patient has not taken anything for her symptoms.  Past Medical History:  Diagnosis Date  . Anxiety   . Bipolar 1 disorder (HCC)   . Chronic back pain   . Heroin addiction (HCC)    recovering  . Panic attacks   . PTSD (post-traumatic stress disorder)     Patient Active Problem List   Diagnosis Date Noted  . Major depressive disorder, recurrent episode, moderate (HCC) 06/10/2015  . PTSD (post-traumatic stress disorder) 06/10/2015    Past Surgical History:  Procedure Laterality Date  . ABDOMINAL HYSTERECTOMY    . APPENDECTOMY    . CESAREAN SECTION    . CHOLECYSTECTOMY       OB History   None      Home Medications    Prior to Admission medications   Medication Sig Start Date End Date Taking? Authorizing Provider  doxycycline (VIBRA-TABS) 100 MG tablet Take 1 tablet (100 mg total) by mouth 2 (two) times daily. 07/23/15   Shade Flood, MD  FLUoxetine (PROZAC) 20 MG capsule Take 1 capsule (20 mg total) by mouth daily. 06/10/15   Charm Rings, NP  hydrOXYzine (ATARAX/VISTARIL) 25 MG tablet Take 1 tablet (25 mg total) by mouth every 6 (six) hours as needed for anxiety. 07/23/15   Shade Flood, MD  hydrOXYzine (ATARAX/VISTARIL) 25 MG tablet Take 1 tablet (25 mg total) by mouth every 6 (six) hours. 02/13/18   Analyssa Downs, Mayer Masker, MD  prazosin (MINIPRESS) 2 MG capsule Take 2 capsules (4 mg total) by mouth at bedtime. 06/10/15   Charm Rings, NP  QUEtiapine (SEROQUEL) 100 MG tablet Take 1 tablet (100 mg total) by mouth at bedtime. 06/10/15   Charm Rings, NP  sulfamethoxazole-trimethoprim (BACTRIM DS,SEPTRA DS) 800-160 MG tablet Take 1 tablet by mouth 2 (two) times daily for 7 days. 02/13/18 02/20/18  Shon Baton, MD    Family History History reviewed. No pertinent family history.  Social History Social History   Tobacco Use  . Smoking status: Current Some Day Smoker    Packs/day: 0.50    Types: Cigarettes  Substance Use Topics  . Alcohol use: No  . Drug use: No  Allergies   Patient has no known allergies.   Review of Systems Review of Systems  Constitutional: Negative for fever.  Skin: Positive for color change and rash. Negative for wound.  All other systems reviewed and are negative.    Physical Exam Updated Vital Signs BP 116/66   Pulse (!) 104   Temp 98.2 F (36.8 C) (Oral)   Resp 18   Ht 1.676 m (5\' 6" )   Wt 59.9 kg   SpO2 100%   BMI 21.31 kg/m   Physical Exam  Constitutional: She is oriented to person, place, and time. She appears well-developed and well-nourished. No distress.  HENT:  Head: Normocephalic and atraumatic.  Eyes: Pupils are equal, round, and reactive to light.  Neck: Neck supple.  Cardiovascular: Normal rate, regular rhythm and normal heart sounds.  Pulmonary/Chest: Effort normal and breath sounds normal. No respiratory distress. She has no wheezes.    Abdominal: Soft. There is no tenderness.  Musculoskeletal: She exhibits no edema.  Neurological: She is alert and oriented to person, place, and time.  Skin: Skin is warm and dry.  Next examination of the left arm reveals multiple papules/pustules with slight erythema, there is an area of induration over the left medial arm that is tender to touch, slight erythema, no warmth, patient has one healing wound over her face.  There are excoriations associated.  No spontaneous drainage.  Psychiatric: She has a normal mood and affect.  Nursing note and vitals reviewed.    ED Treatments / Results  Labs (all labs ordered are listed, but only abnormal results are displayed) Labs Reviewed - No data to display  EKG None  Radiology No results found.  Procedures Procedures (including critical care time)  Medications Ordered in ED Medications  hydrOXYzine (ATARAX/VISTARIL) tablet 25 mg (has no administration in time range)     Initial Impression / Assessment and Plan / ED Course  I have reviewed the triage vital signs and the nursing notes.  Pertinent labs & imaging results that were available during my care of the patient were reviewed by me and considered in my medical decision making (see chart for details).     Patient presents with a rash over the left lower.  She is overall nontoxic-appearing and vital signs are notable for slight tachycardia to 106.  She is afebrile.  She does have one area that appears to be early cellulitis over the medial aspect of the left arm.  She has no risk factors and denies any history of bedbugs.  No other family members have similar symptoms.  Lesions on his have an appearance of a bite.  The tattoo on her left arm does not show any significant erythema or evidence of infection.  Will treat patient supportively and will give Bactrim for early cellulitis of lesion of the left medial arm.  Recommend hydroxyzine for itching.  I have requested that she avoid  scratching as this will spread any potential bacteria that may be present.  Patient stated understanding.  After history, exam, and medical workup I feel the patient has been appropriately medically screened and is safe for discharge home. Pertinent diagnoses were discussed with the patient. Patient was given return precautions.   Final Clinical Impressions(s) / ED Diagnoses   Final diagnoses:  Rash    ED Discharge Orders         Ordered    sulfamethoxazole-trimethoprim (BACTRIM DS,SEPTRA DS) 800-160 MG tablet  2 times daily     02/13/18 2315  hydrOXYzine (ATARAX/VISTARIL) 25 MG tablet  Every 6 hours     02/13/18 2315           Shon Baton, MD 02/13/18 2336

## 2018-02-13 NOTE — ED Notes (Addendum)
Pt states that she had tattoo colored in on her left arm 6 days ago. She says that "red sores" began appearing on her arm began appearing on Thursday and presented with a pimple like top. The sore on her medial left arm in the antecubital area is "hot and tender to touch". The other sores on forearm are tender to touch. She states that she feels these are spreading to her face She has 2 - 3 sores on face.

## 2018-02-13 NOTE — Discharge Instructions (Addendum)
If you develop fever, worsening redness/rash you should be re-evaluated.

## 2018-06-01 ENCOUNTER — Emergency Department (HOSPITAL_BASED_OUTPATIENT_CLINIC_OR_DEPARTMENT_OTHER)
Admission: EM | Admit: 2018-06-01 | Discharge: 2018-06-01 | Disposition: A | Discharge status: Home / Self Care | Payer: Medicare Other | Attending: Emergency Medicine | Admitting: Emergency Medicine

## 2018-06-01 ENCOUNTER — Encounter (HOSPITAL_BASED_OUTPATIENT_CLINIC_OR_DEPARTMENT_OTHER): Payer: Self-pay | Admitting: Emergency Medicine

## 2018-06-01 ENCOUNTER — Other Ambulatory Visit: Payer: Self-pay

## 2018-06-01 DIAGNOSIS — N760 Acute vaginitis: Secondary | ICD-10-CM

## 2018-06-01 DIAGNOSIS — Z202 Contact with and (suspected) exposure to infections with a predominantly sexual mode of transmission: Secondary | ICD-10-CM | POA: Diagnosis present

## 2018-06-01 DIAGNOSIS — F17210 Nicotine dependence, cigarettes, uncomplicated: Secondary | ICD-10-CM | POA: Diagnosis not present

## 2018-06-01 DIAGNOSIS — N3000 Acute cystitis without hematuria: Secondary | ICD-10-CM

## 2018-06-01 DIAGNOSIS — Z79899 Other long term (current) drug therapy: Secondary | ICD-10-CM | POA: Insufficient documentation

## 2018-06-01 LAB — URINALYSIS, MICROSCOPIC (REFLEX)

## 2018-06-01 LAB — URINALYSIS, ROUTINE W REFLEX MICROSCOPIC
Bilirubin Urine: NEGATIVE
Glucose, UA: NEGATIVE mg/dL
Ketones, ur: NEGATIVE mg/dL
Nitrite: NEGATIVE
Protein, ur: NEGATIVE mg/dL
Specific Gravity, Urine: 1.025 (ref 1.005–1.030)
pH: 5.5 (ref 5.0–8.0)

## 2018-06-01 LAB — WET PREP, GENITAL
Clue Cells Wet Prep HPF POC: NONE SEEN
Sperm: NONE SEEN
Trich, Wet Prep: NONE SEEN
Yeast Wet Prep HPF POC: NONE SEEN

## 2018-06-01 MED ORDER — CEFTRIAXONE SODIUM 250 MG IJ SOLR
250.0000 mg | Freq: Once | INTRAMUSCULAR | Status: AC
  Administered 2018-06-01: 250 mg via INTRAMUSCULAR
  Filled 2018-06-01: qty 250

## 2018-06-01 MED ORDER — HYDROCODONE-ACETAMINOPHEN 5-325 MG PO TABS
2.0000 | ORAL_TABLET | Freq: Once | ORAL | Status: AC
  Administered 2018-06-01: 2 via ORAL
  Filled 2018-06-01: qty 2

## 2018-06-01 MED ORDER — AZITHROMYCIN 250 MG PO TABS
1000.0000 mg | ORAL_TABLET | Freq: Once | ORAL | Status: AC
  Administered 2018-06-01: 1000 mg via ORAL
  Filled 2018-06-01: qty 4

## 2018-06-01 MED ORDER — CIPROFLOXACIN HCL 500 MG PO TABS: 500.0000 mg | ORAL_TABLET | Freq: Two times a day (BID) | ORAL | 0 refills | Status: DC

## 2018-06-01 MED ORDER — HYDROCODONE-ACETAMINOPHEN 5-325 MG PO TABS: 1.0000 | ORAL_TABLET | Freq: Four times a day (QID) | ORAL | 0 refills | Status: AC | PRN

## 2018-06-01 MED ORDER — LIDOCAINE HCL (PF) 1 % IJ SOLN
INTRAMUSCULAR | Status: AC
  Administered 2018-06-01: 2.1 mL
  Filled 2018-06-01: qty 5

## 2018-06-01 NOTE — ED Triage Notes (Signed)
Pt states she has a vaginal discomfort since yesterday, she has a new partner and she didn't use any protection. Pt states pain is 8/10.

## 2018-06-01 NOTE — ED Notes (Signed)
PT states understanding of care given, follow up care, and medication prescribed. PT  ambulated from ED to car with a steady gait. Pt has family coming to pick up patient at this time.

## 2018-06-01 NOTE — ED Provider Notes (Signed)
[facility] EMERGENCY DEPARTMENT Provider Note   CSN: [id number] Arrival date & time: 06/01/18  0456     History   Chief Complaint Chief Complaint  Patient presents with  . Exposure to STD    HPI Beth Best is a 43 y.o. female.  Patient is a 43 year old female with past medical history of anxiety, bipolar, chronic back pain, PTSD.  She presents today for evaluation of vaginal pain.  She reports a new sexual partner over the past several months.  At first she was using condoms, however sex is recently been unprotected.  She denies any discharge.  The history is provided by the patient.  Exposure to STD  This is a new problem. The current episode started 2 days ago. The problem occurs constantly. The problem has been rapidly worsening. Pertinent negatives include no abdominal pain. Nothing aggravates the symptoms. Nothing relieves the symptoms.    Past Medical History:  Diagnosis Date  . Anxiety   . Bipolar 1 disorder (HCC)   . Chronic back pain   . Heroin addiction (HCC)    recovering  . Panic attacks   . PTSD (post-traumatic stress disorder)     Patient Active Problem List   Diagnosis Date Noted  . Major depressive disorder, recurrent episode, moderate (HCC) 06/10/2015  . PTSD (post-traumatic stress disorder) 06/10/2015    Past Surgical History:  Procedure Laterality Date  . ABDOMINAL HYSTERECTOMY    . APPENDECTOMY    . CESAREAN SECTION    . CHOLECYSTECTOMY       OB History   No obstetric history on file.      Home Medications    Prior to Admission medications   Medication Sig Start Date End Date Taking? Authorizing Provider  chlorhexidine (HIBICLENS) 4 % external liquid Apply topically daily as needed. 02/13/18   Horton, Mayer Masker, MD  ciprofloxacin (CIPRO) 500 MG tablet Take 1 tablet (500 mg total) by mouth 2 (two) times daily. One po bid x 7 days 06/01/18   Geoffery Lyons, MD  doxycycline (VIBRA-TABS) 100 MG tablet Take 1 tablet (100  mg total) by mouth 2 (two) times daily. 07/23/15   Shade Flood, MD  FLUoxetine (PROZAC) 20 MG capsule Take 1 capsule (20 mg total) by mouth daily. 06/10/15   Charm Rings, NP  HYDROcodone-acetaminophen (NORCO) 5-325 MG tablet Take 1-2 tablets by mouth every 6 (six) hours as needed. 06/01/18   Geoffery Lyons, MD  hydrOXYzine (ATARAX/VISTARIL) 25 MG tablet Take 1 tablet (25 mg total) by mouth every 6 (six) hours as needed for anxiety. 07/23/15   Shade Flood, MD  hydrOXYzine (ATARAX/VISTARIL) 25 MG tablet Take 1 tablet (25 mg total) by mouth every 6 (six) hours. 02/13/18   Horton, Mayer Masker, MD  prazosin (MINIPRESS) 2 MG capsule Take 2 capsules (4 mg total) by mouth at bedtime. 06/10/15   Charm Rings, NP  QUEtiapine (SEROQUEL) 100 MG tablet Take 1 tablet (100 mg total) by mouth at bedtime. 06/10/15   Charm Rings, NP    Family History No family history on file.  Social History Social History   Tobacco Use  . Smoking status: Current Some Day Smoker    Packs/day: 0.50    Types: Cigarettes  Substance Use Topics  . Alcohol use: No  . Drug use: No     Allergies   Patient has no known allergies.   Review of Systems Review of Systems  Gastrointestinal: Negative for abdominal pain.  All  other systems reviewed and are negative.    Physical Exam Updated Vital Signs BP 123/80 (BP Location: Left Arm)   Pulse (!) 102   Temp 97.8 F (36.6 C) (Oral)   Resp 18   Ht 5\' 6"  (1.676 m)   Wt 59 kg   SpO2 98%   BMI 20.98 kg/m   Physical Exam Vitals signs and nursing note reviewed.  Constitutional:      Appearance: Normal appearance.  HENT:     Head: Normocephalic and atraumatic.  Pulmonary:     Effort: Pulmonary effort is normal.  Genitourinary:    General: Normal vulva.     Vagina: No vaginal discharge.     Comments: There were no obvious lesions noted.  Patient is status post hysterectomy. Neurological:     Mental Status: She is alert and oriented to person, place,  and time.      ED Treatments / Results  Labs (all labs ordered are listed, but only abnormal results are displayed) Labs Reviewed  WET PREP, GENITAL - Abnormal; Notable for the following components:      Result Value   WBC, Wet Prep HPF POC MODERATE (*)    All other components within normal limits  URINALYSIS, ROUTINE W REFLEX MICROSCOPIC - Abnormal; Notable for the following components:   APPearance CLOUDY (*)    Hgb urine dipstick LARGE (*)    Leukocytes, UA LARGE (*)    All other components within normal limits  URINALYSIS, MICROSCOPIC (REFLEX) - Abnormal; Notable for the following components:   Bacteria, UA MANY (*)    All other components within normal limits  HSV CULTURE AND TYPING  GC/CHLAMYDIA PROBE AMP ([place]) NOT AT [facility]    EKG None  Radiology No results found.  Procedures Procedures (including critical care time)  Medications Ordered in ED Medications  cefTRIAXone (ROCEPHIN) injection 250 mg (250 mg Intramuscular Given 06/01/18 0541)  azithromycin (ZITHROMAX) tablet 1,000 mg (1,000 mg Oral Given 06/01/18 0539)  lidocaine (PF) (XYLOCAINE) 1 % injection (2.1 mLs  Given 06/01/18 0541)  HYDROcodone-acetaminophen (NORCO/VICODIN) 5-325 MG per tablet 2 tablet (2 tablets Oral Given 06/01/18 6222)     Initial Impression / Assessment and Plan / ED Course  I have reviewed the triage vital signs and the nursing notes.  Pertinent labs & imaging results that were available during my care of the patient were reviewed by me and considered in my medical decision making (see chart for details).  Patient treated presumptively for GC and chlamydia.  Viral culture pending.  Wet prep negative for Trichomonas, but does show white cells.  She was given Rocephin and Zithromax here in the ER.  Urinalysis was also suggestive of a UTI.  She will be started on Cipro and is to follow-up as needed.  Final Clinical Impressions(s) / ED Diagnoses   Final diagnoses:  Acute vaginitis    Acute cystitis without hematuria    ED Discharge Orders         Ordered    ciprofloxacin (CIPRO) 500 MG tablet  2 times daily     06/01/18 0609    HYDROcodone-acetaminophen (NORCO) 5-325 MG tablet  Every 6 hours PRN     06/01/18 9798           Geoffery Lyons, MD 06/01/18 [phone]

## 2018-06-01 NOTE — Discharge Instructions (Addendum)
Cipro as prescribed.  Hydrocodone as prescribed as needed for pain.  We will call you if your cultures indicate you require further treatment or action.

## 2018-06-02 LAB — GC/CHLAMYDIA PROBE AMP (~~LOC~~) NOT AT ARMC
Chlamydia: NEGATIVE
Neisseria Gonorrhea: NEGATIVE

## 2018-06-03 LAB — HSV CULTURE AND TYPING

## 2018-09-21 ENCOUNTER — Emergency Department (HOSPITAL_COMMUNITY): Payer: Medicare Other

## 2018-09-21 ENCOUNTER — Other Ambulatory Visit: Payer: Self-pay

## 2018-09-21 ENCOUNTER — Encounter (HOSPITAL_COMMUNITY): Payer: Self-pay | Admitting: Emergency Medicine

## 2018-09-21 ENCOUNTER — Emergency Department (HOSPITAL_COMMUNITY)
Admission: EM | Admit: 2018-09-21 | Discharge: 2018-09-21 | Disposition: A | Discharge status: Home / Self Care | Payer: Medicare Other | Attending: Emergency Medicine | Admitting: Emergency Medicine

## 2018-09-21 DIAGNOSIS — D696 Thrombocytopenia, unspecified: Secondary | ICD-10-CM

## 2018-09-21 DIAGNOSIS — R404 Transient alteration of awareness: Secondary | ICD-10-CM | POA: Diagnosis present

## 2018-09-21 DIAGNOSIS — F17210 Nicotine dependence, cigarettes, uncomplicated: Secondary | ICD-10-CM | POA: Insufficient documentation

## 2018-09-21 DIAGNOSIS — F1910 Other psychoactive substance abuse, uncomplicated: Secondary | ICD-10-CM | POA: Diagnosis not present

## 2018-09-21 DIAGNOSIS — T50901A Poisoning by unspecified drugs, medicaments and biological substances, accidental (unintentional), initial encounter: Secondary | ICD-10-CM | POA: Insufficient documentation

## 2018-09-21 DIAGNOSIS — F319 Bipolar disorder, unspecified: Secondary | ICD-10-CM | POA: Insufficient documentation

## 2018-09-21 DIAGNOSIS — Z79899 Other long term (current) drug therapy: Secondary | ICD-10-CM | POA: Insufficient documentation

## 2018-09-21 LAB — RAPID URINE DRUG SCREEN, HOSP PERFORMED
Amphetamines: POSITIVE — AB
Barbiturates: NOT DETECTED
Benzodiazepines: POSITIVE — AB
Cocaine: POSITIVE — AB
Opiates: NOT DETECTED
Tetrahydrocannabinol: NOT DETECTED

## 2018-09-21 LAB — I-STAT CHEM 8, ED
BUN: 17 mg/dL (ref 6–20)
Calcium, Ion: 1.15 mmol/L (ref 1.15–1.40)
Chloride: 107 mmol/L (ref 98–111)
Creatinine, Ser: 0.7 mg/dL (ref 0.44–1.00)
Glucose, Bld: 81 mg/dL (ref 70–99)
HCT: 40 % (ref 36.0–46.0)
Hemoglobin: 13.6 g/dL (ref 12.0–15.0)
Potassium: 5 mmol/L (ref 3.5–5.1)
Sodium: 139 mmol/L (ref 135–145)
TCO2: 27 mmol/L (ref 22–32)

## 2018-09-21 LAB — CBC
HCT: 41.9 % (ref 36.0–46.0)
Hemoglobin: 13.5 g/dL (ref 12.0–15.0)
MCH: 30.3 pg (ref 26.0–34.0)
MCHC: 32.2 g/dL (ref 30.0–36.0)
MCV: 94.2 fL (ref 80.0–100.0)
Platelets: 225 10*3/uL (ref 150–400)
RBC: 4.45 MIL/uL (ref 3.87–5.11)
RDW: 12.1 % (ref 11.5–15.5)
WBC: 6.6 10*3/uL (ref 4.0–10.5)
nRBC: 0 % (ref 0.0–0.2)

## 2018-09-21 LAB — CBC WITH DIFFERENTIAL/PLATELET
Abs Immature Granulocytes: 0 10*3/uL (ref 0.00–0.07)
Basophils Absolute: 0 10*3/uL (ref 0.0–0.1)
Basophils Relative: 0 %
Eosinophils Absolute: 0.1 10*3/uL (ref 0.0–0.5)
Eosinophils Relative: 2 %
HCT: 40 % (ref 36.0–46.0)
Hemoglobin: 13.2 g/dL (ref 12.0–15.0)
Immature Granulocytes: 0 %
Lymphocytes Relative: 54 %
Lymphs Abs: 2.7 10*3/uL (ref 0.7–4.0)
MCH: 30.3 pg (ref 26.0–34.0)
MCHC: 33 g/dL (ref 30.0–36.0)
MCV: 92 fL (ref 80.0–100.0)
Monocytes Absolute: 0.5 10*3/uL (ref 0.1–1.0)
Monocytes Relative: 9 %
Neutro Abs: 1.8 10*3/uL (ref 1.7–7.7)
Neutrophils Relative %: 35 %
Platelets: <5 10*3/uL — CL (ref 150–400)
RBC: 4.35 MIL/uL (ref 3.87–5.11)
RDW: 12.2 % (ref 11.5–15.5)
WBC: 5 10*3/uL (ref 4.0–10.5)
nRBC: 0 % (ref 0.0–0.2)

## 2018-09-21 LAB — BASIC METABOLIC PANEL
Anion gap: 11 (ref 5–15)
BUN: 12 mg/dL (ref 6–20)
CO2: 19 mmol/L — ABNORMAL LOW (ref 22–32)
Calcium: 8.4 mg/dL — ABNORMAL LOW (ref 8.9–10.3)
Chloride: 110 mmol/L (ref 98–111)
Creatinine, Ser: 0.78 mg/dL (ref 0.44–1.00)
GFR calc Af Amer: >60 mL/min (ref >60)
GFR calc non Af Amer: >60 mL/min (ref >60)
Glucose, Bld: 76 mg/dL (ref 70–99)
Potassium: 4.5 mmol/L (ref 3.5–5.1)
Sodium: 140 mmol/L (ref 135–145)

## 2018-09-21 LAB — HEPATIC FUNCTION PANEL
ALT: 25 U/L (ref 0–44)
AST: 24 U/L (ref 15–41)
Albumin: 3.6 g/dL (ref 3.5–5.0)
Alkaline Phosphatase: 67 U/L (ref 38–126)
Bilirubin, Direct: <0.1 mg/dL (ref 0.0–0.2)
Total Bilirubin: 0.9 mg/dL (ref 0.3–1.2)
Total Protein: 6.4 g/dL — ABNORMAL LOW (ref 6.5–8.1)

## 2018-09-21 LAB — URINALYSIS, ROUTINE W REFLEX MICROSCOPIC
Bilirubin Urine: NEGATIVE
Glucose, UA: NEGATIVE mg/dL
Hgb urine dipstick: NEGATIVE
Ketones, ur: NEGATIVE mg/dL
Leukocytes,Ua: NEGATIVE
Nitrite: NEGATIVE
Protein, ur: NEGATIVE mg/dL
Specific Gravity, Urine: 1.014 (ref 1.005–1.030)
pH: 6 (ref 5.0–8.0)

## 2018-09-21 LAB — I-STAT BETA HCG BLOOD, ED (MC, WL, AP ONLY): I-stat hCG, quantitative: <5 m[IU]/mL (ref <5)

## 2018-09-21 LAB — SAVE SMEAR(SSMR), FOR PROVIDER SLIDE REVIEW

## 2018-09-21 LAB — PROTIME-INR
INR: 0.9 (ref 0.8–1.2)
Prothrombin Time: 12.4 seconds (ref 11.4–15.2)

## 2018-09-21 LAB — SEDIMENTATION RATE: Sed Rate: 2 mm/hr (ref 0–22)

## 2018-09-21 LAB — LACTATE DEHYDROGENASE: LDH: 181 U/L (ref 98–192)

## 2018-09-21 LAB — C-REACTIVE PROTEIN: CRP: <0.8 mg/dL (ref <1.0)

## 2018-09-21 LAB — ETHANOL: Alcohol, Ethyl (B): <10 mg/dL (ref <10)

## 2018-09-21 MED ORDER — PREDNISONE 20 MG PO TABS
60.0000 mg | ORAL_TABLET | Freq: Once | ORAL | Status: AC
  Administered 2018-09-21: 19:00:00 60 mg via ORAL
  Filled 2018-09-21: qty 3

## 2018-09-21 NOTE — ED Notes (Signed)
Gave pt phone to call family. 

## 2018-09-21 NOTE — ED Notes (Signed)
Patient transported to CT 

## 2018-09-21 NOTE — Discharge Instructions (Signed)
1.  You need to call your family doctor tomorrow morning to schedule a recheck as soon as possible. 2.  Drug overdose is extremely dangerous.  You might die if you continue to use drugs.  Please use the resource guide in your discharge instructions to find a treatment program. 3.  Return to the emergency department if you have any thoughts of hurting yourself, killing yourself hurting anyone else, other symptoms of fever, chills, chest pain, shortness of breath or other concerning symptoms. 4.  Your initial lab work showed some abnormalities in the blood work however when this was repeated, the blood work was normal and this represented a lab error in assessing the platelets.  Do not need any special evaluation regarding problems with your blood work.

## 2018-09-21 NOTE — ED Notes (Signed)
This RN made EDP aware the pt may leave AMA due to patient being concerned about grandchildren.

## 2018-09-21 NOTE — ED Notes (Signed)
Thurnell Garbe (mother of patient) [phone]

## 2018-09-21 NOTE — ED Provider Notes (Addendum)
Drug OD suspected, no narcan given b/c RR ok. Now awakening. Per daughter patient has stress induced seizures and goes unresponsive. Follow labs and head CT, anticipated likely D/C.  Patient has severe thrombocytopenia.  Have consulted hematology.  Patient will need admission.  Patient is review of systems is negative for any bruising, easy bleeding, dark or bloody appearing stool, vaginal bleeding, vomiting of blood. Physical Exam  BP 114/84   Pulse 95   Temp (!) 96.5 F (35.8 C) (Temporal)   Resp 16   SpO2 93%   Physical Exam Constitutional:      Comments: Patient awakens to light voice and can sit up in the bed and speak normally.  No respiratory distress.  Mental status is now clear.  HENT:     Mouth/Throat:     Mouth: Mucous membranes are moist.     Pharynx: Oropharynx is clear.  Eyes:     Extraocular Movements: Extraocular movements intact.     Pupils: Pupils are equal, round, and reactive to light.  Cardiovascular:     Rate and Rhythm: Normal rate and regular rhythm.  Pulmonary:     Effort: Pulmonary effort is normal.     Breath sounds: Normal breath sounds.  Abdominal:     General: There is no distension.     Palpations: Abdomen is soft. There is no mass.     Tenderness: There is no abdominal tenderness.     Comments: No hepatosplenomegaly  Musculoskeletal: Normal range of motion.        General: No swelling or tenderness.  Skin:    General: Skin is warm and dry.     Comments: Patient does not have bruising or petechiae on her skin.  He does have many scars including self cutting scars that are old on her upper extremities.  Examination of the patient's feet shows 2 areas that are consistent with track marks that are more recent.  Patient however denies that these are track marks although that is the appearance.  One vein has a grayish-brown hyperpigmentation with 3 small punctate eschars on it.  Neurological:     General: No focal deficit present.     Mental Status:  She is oriented to person, place, and time.     Coordination: Coordination normal.     ED Course/Procedures   Clinical Course as of Sep 21 1910  Wed Sep 21, 2018  1206 Differential diagnosis includes opiate overdose, polysubstance overdose, alcohol intoxication, CNS injury, hypoglycemia   [MB]  1231 Patient seems to be improving mental status wise so will continue to observe.  She denies any overdose.   [MB]  1317 Patient continues to be arousable to voice and some stimulation.   [MB]  1351 Reevaluated patient.  She once awake and is able to answer questions.  She still denies that she took anything.  She is clearly more coherent.   [MB]  1417 Patient's daughter called and and I called her back.  She says her mom has frequent seizures that are brought on by stress and she will be unresponsive but usually for short period of time.  She said that her mom was taking care of her grandkids when they contacted their mom and said that grandma was unresponsive.   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   [MB]  1731 Patient is resting  as I come in the room but she responds easily.  Her speech is coherent and situationally appropriate.  She reports that she really does not remember what happened and how she got here.  She continues to deny drug use.  I did point out what looks suspicious for track marks on her feet but she reports that that is from old injuries.  He is very concerned that nobody else know about her tox screen.  I have advised her that her platelets are critically low and at this time I do not know why.  She has been advised that we must get further diagnostic evaluation and consultation with hematology.  Patient is advised that she could risk death from bleeding at any potential site spontaneously or from injury.  Patient denies she has had any easy bruising or  bleeding.  She denies she is had dark or black stool.  She denies vomiting any blood.  She denies any prior history of DVT or PE.   [MP]  1912 Consult: Reviewed with Dr. Clelia CroftShadad of hematology.  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  Start prednisone 1 mg/kg daily orally.  They will reassess.   [MP]    Clinical Course User Index [MB] Terrilee FilesButler, Michael C, MD [MP] Arby BarrettePfeiffer, Aalyah Mansouri, MD  Consult: Reviewed with internal medicine teaching service for admission.  Procedures  MDM  Her mental status is normal.  No confusion or altered mental status after observation.  Patient has no focal neurologic deficits.  She has critical thrombocytopenia and as per consultation with hematology, plan will be for admission and observation to see if platelets are recovering with prednisone therapy.  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  Patient was admitted to medical service but a repeat CBC showed normal platelets.  This is consistent with the patient's clinical picture.  Apparently there was some lab error in initial specimen that despite reportedly confirmed was still erroneous.  I have reviewed this with the internal medicine teaching service.  At this time they do not plan to proceed with admission.  Have done consultation but patient will be discharged from the emergency department.      Arby BarrettePfeiffer, Trudie Cervantes, MD 09/21/18 Angelene Giovanni1913    Arby BarrettePfeiffer, Aya Geisel, MD 09/21/18 2108

## 2018-09-21 NOTE — Care Management (Signed)
ED CM received return call from [facility] DSS case worker Mrs. Delford Field, CPS report was taken.

## 2018-09-21 NOTE — Care Management (Addendum)
ED CM consulted by Caren Griffins RN concerns over patient being brought in unresponsive. Patient was apparent babysitting her  Grandchildren at home age 45/6 when she became unresponsive.  The 43 yo found patient and call her mom and EMS. Patient reports she lives with her daughter and babysits her grandchildren when daughter is at work.  ED evaulation included a drug tox which was positive+ for opiates, cocaine, benzo's, and amphetamines.    ED CM met with patient at bedside she appears to have what would be consistent with track marks, about her arms. Discussed with patient that a report will be filed  With  DSS CPS to investigate the safety and welfare of the minors in the home.  Patient was upset stating " I had a seizure"  No record of seizures or seizure  medications noted in record.  DSS report imitated awaiting a return call from the after hours Case Worker on call.

## 2018-09-21 NOTE — ED Notes (Signed)
Phlebotomy at bedside.

## 2018-09-21 NOTE — ED Provider Notes (Signed)
[facility] EMERGENCY DEPARTMENT Provider Note   CSN: [id number] Arrival date & time: 09/21/18  1143    History   Chief Complaint Chief Complaint  Patient presents with  . Drug Overdose  . Lethargic    HPI Beth Best is a 43 y.o. female.  She is brought in by EMS after being found unresponsive on the floor at home.  No reported trauma.  EMS found with pinpoint pupils but was spontaneously breathing and so elected not to give her any Narcan.  She has a history of opiate overdose.  Last known well was this morning at around 930.  Level 5 caveat is patient is not able to give any history.     The history is provided by the patient.  Drug Overdose  The current episode started less than 1 hour ago. The problem has been gradually improving. She has tried nothing for the symptoms. The treatment provided no relief.    Past Medical History:  Diagnosis Date  . Anxiety   . Bipolar 1 disorder (HCC)   . Chronic back pain   . Heroin addiction (HCC)    recovering  . Panic attacks   . PTSD (post-traumatic stress disorder)     Patient Active Problem List   Diagnosis Date Noted  . Major depressive disorder, recurrent episode, moderate (HCC) 06/10/2015  . PTSD (post-traumatic stress disorder) 06/10/2015    Past Surgical History:  Procedure Laterality Date  . ABDOMINAL HYSTERECTOMY    . APPENDECTOMY    . CESAREAN SECTION    . CHOLECYSTECTOMY       OB History   No obstetric history on file.      Home Medications    Prior to Admission medications   Medication Sig Start Date End Date Taking? Authorizing Provider  chlorhexidine (HIBICLENS) 4 % external liquid Apply topically daily as needed. 02/13/18   Horton, Mayer Masker, MD  ciprofloxacin (CIPRO) 500 MG tablet Take 1 tablet (500 mg total) by mouth 2 (two) times daily. One po bid x 7 days 06/01/18   Geoffery Lyons, MD  doxycycline (VIBRA-TABS) 100 MG tablet Take 1 tablet (100 mg total) by mouth 2 (two)  times daily. 07/23/15   Shade Flood, MD  FLUoxetine (PROZAC) 20 MG capsule Take 1 capsule (20 mg total) by mouth daily. 06/10/15   Charm Rings, NP  HYDROcodone-acetaminophen (NORCO) 5-325 MG tablet Take 1-2 tablets by mouth every 6 (six) hours as needed. 06/01/18   Geoffery Lyons, MD  hydrOXYzine (ATARAX/VISTARIL) 25 MG tablet Take 1 tablet (25 mg total) by mouth every 6 (six) hours as needed for anxiety. 07/23/15   Shade Flood, MD  hydrOXYzine (ATARAX/VISTARIL) 25 MG tablet Take 1 tablet (25 mg total) by mouth every 6 (six) hours. 02/13/18   Horton, Mayer Masker, MD  prazosin (MINIPRESS) 2 MG capsule Take 2 capsules (4 mg total) by mouth at bedtime. 06/10/15   Charm Rings, NP  QUEtiapine (SEROQUEL) 100 MG tablet Take 1 tablet (100 mg total) by mouth at bedtime. 06/10/15   Charm Rings, NP    Family History History reviewed. No pertinent family history.  Social History Social History   Tobacco Use  . Smoking status: Current Some Day Smoker    Packs/day: 0.50    Types: Cigarettes  Substance Use Topics  . Alcohol use: No  . Drug use: No     Allergies   Patient has no known allergies.   Review of Systems Review  of Systems  Unable to perform ROS: Mental status change     Physical Exam Updated Vital Signs BP 126/80 (BP Location: Right Arm)   Pulse 72   Temp 97.8 F (36.6 C)   Resp 17   SpO2 97%   Physical Exam Vitals signs and nursing note reviewed.  Constitutional:      General: She is not in acute distress.    Appearance: She is well-developed.  HENT:     Head: Normocephalic and atraumatic.  Eyes:     Conjunctiva/sclera: Conjunctivae normal.     Pupils: Pupils are equal, round, and reactive to light.     Comments: Pupils are about 4 mm and reactive.  Neck:     Musculoskeletal: Neck supple.  Cardiovascular:     Rate and Rhythm: Normal rate and regular rhythm.     Heart sounds: No murmur.  Pulmonary:     Effort: Pulmonary effort is normal. No  respiratory distress.     Breath sounds: Normal breath sounds.  Abdominal:     Palpations: Abdomen is soft.     Tenderness: There is no abdominal tenderness.  Musculoskeletal: Normal range of motion.        General: No signs of injury.     Right lower leg: No edema.     Left lower leg: No edema.  Skin:    General: Skin is warm and dry.     Capillary Refill: Capillary refill takes less than 2 seconds.  Neurological:     Mental Status: She is alert.     Comments: Patient is arousable to voice.  She will answer some questions.  She is moving all extremities and will localize pain.      ED Treatments / Results  Labs (all labs ordered are listed, but only abnormal results are displayed) Labs Reviewed  RAPID URINE DRUG SCREEN, HOSP PERFORMED - Abnormal; Notable for the following components:      Result Value   Cocaine POSITIVE (*)    Benzodiazepines POSITIVE (*)    Amphetamines POSITIVE (*)    All other components within normal limits  BASIC METABOLIC PANEL - Abnormal; Notable for the following components:   CO2 19 (*)    Calcium 8.4 (*)    All other components within normal limits  CBC WITH DIFFERENTIAL/PLATELET - Abnormal; Notable for the following components:   Platelets <5 (*)    All other components within normal limits  HEPATIC FUNCTION PANEL - Abnormal; Notable for the following components:   Total Protein 6.4 (*)    All other components within normal limits  SEDIMENTATION RATE  C-REACTIVE PROTEIN  PROTIME-INR  LACTATE DEHYDROGENASE  CBC  SAVE SMEAR (SSMR)  HIV ANTIBODY (ROUTINE TESTING W REFLEX)  ETHANOL  URINALYSIS, ROUTINE W REFLEX MICROSCOPIC  HEPATITIS C ANTIBODY  I-STAT CHEM 8, ED  I-STAT BETA HCG BLOOD, ED ([facility], [facility], [facility] ONLY)    EKG EKG Interpretation  Date/Time:  Wednesday Sep 21 2018 11:46:39 EDT Ventricular Rate:  56 PR Interval:    QRS Duration: 106 QT Interval:  442 QTC Calculation: 427 R Axis:   88 Text Interpretation:  Sinus rhythm  nonspecific st/ts similar to prior 2/17 Confirmed by Meridee ScoreButler, Deston Bilyeu [phone]([id number]) on 09/21/2018 12:08:12 PM   Radiology Ct Head Wo Contrast  Result Date: 09/21/2018 CLINICAL DATA:  Altered level of consciousness EXAM: CT HEAD WITHOUT CONTRAST TECHNIQUE: Contiguous axial images were obtained from the base of the skull through the vertex without intravenous contrast. COMPARISON:  06/15/2014 FINDINGS:  Brain: No evidence of acute infarction, hemorrhage, hydrocephalus, extra-axial collection or mass lesion/mass effect. Vascular: No hyperdense vessel or unexpected calcification. Skull: Normal. Negative for fracture or focal lesion. Sinuses/Orbits: No acute finding. Other: None. IMPRESSION: No acute intracranial abnormality detected. Electronically Signed   By: Katherine Mantle M.D.   On: 09/21/2018 15:53    Procedures Procedures (including critical care time)  Medications Ordered in ED Medications - No data to display   Initial Impression / Assessment and Plan / ED Course  I have reviewed the triage vital signs and the nursing notes.  Pertinent labs & imaging results that were available during my care of the patient were reviewed by me and considered in my medical decision making (see chart for details).  Clinical Course as of Sep 21 1616  Wed Sep 21, 2018  1206 Differential diagnosis includes opiate overdose, polysubstance overdose, alcohol intoxication, CNS injury, hypoglycemia   [MB]  1231 Patient seems to be improving mental status wise so will continue to observe.  She denies any overdose.   [MB]  1317 Patient continues to be arousable to voice and some stimulation.   [MB]  1351 Reevaluated patient.  She once awake and is able to answer questions.  She still denies that she took anything.  She is clearly more coherent.   [MB]  1417 Patient's daughter called and and I called her back.  She says her mom has frequent seizures that are brought on by stress and she will be unresponsive but  usually for short period of time.  She said that her mom was taking care of her grandkids when they contacted their mom and said that grandma was unresponsive.   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   [MB]  1731 Patient is resting as I come in the room but she responds easily.  Her speech is coherent and situationally appropriate.  She reports that she really does not remember what happened and how she got here.  She continues to deny drug use.  I did point out what looks suspicious for track marks on her feet but she reports that that is from old injuries.  He is very concerned that nobody else know about her tox screen.  I have advised her that her platelets are critically low and at this time I do not know why.  She has been advised that we must get further diagnostic evaluation and consultation with hematology.  Patient is advised that she could risk death from bleeding at any potential site spontaneously or from injury.  Patient denies she has had any easy bruising or bleeding.  She denies she is had dark or black stool.  She denies vomiting any blood.  She denies any prior history of DVT or PE.   [MP]  1912 Consult: Reviewed with Dr. Clelia Croft of hematology.  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  Start prednisone 1 mg/kg daily orally.  They will reassess.   [MP]    Clinical Course User Index [MB] Terrilee Files, MD [MP] Arby Barrette, MD        Final Clinical Impressions(s) / ED Diagnoses   Final diagnoses:  Polysubstance abuse [facility])  Accidental drug overdose, initial encounter    ED Discharge Orders    None       Terrilee Files, MD 09/22/18 1622

## 2018-09-21 NOTE — ED Triage Notes (Addendum)
Pt here via [facility]: pt's daughter called EMS, pt's 2 grandchildren found pt on floor unresponsive.  [place] pupils noted on arrival. VSS  Pt has a history of opiate overdose.  LSN 09/21/18 0924.
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
## 2018-09-22 LAB — HIV ANTIBODY (ROUTINE TESTING W REFLEX): HIV Screen 4th Generation wRfx: NONREACTIVE

## 2018-09-23 LAB — HEPATITIS C ANTIBODY: HCV Ab: >11 s/co ratio — ABNORMAL HIGH (ref 0.0–0.9)

## 2018-11-23 ENCOUNTER — Other Ambulatory Visit: Payer: Self-pay

## 2018-11-23 ENCOUNTER — Encounter (HOSPITAL_BASED_OUTPATIENT_CLINIC_OR_DEPARTMENT_OTHER): Payer: Self-pay

## 2018-11-23 ENCOUNTER — Emergency Department (HOSPITAL_BASED_OUTPATIENT_CLINIC_OR_DEPARTMENT_OTHER)
Admission: EM | Admit: 2018-11-23 | Discharge: 2018-11-23 | Disposition: A | Discharge status: Home / Self Care | Payer: Medicare Other | Attending: Emergency Medicine | Admitting: Emergency Medicine

## 2018-11-23 DIAGNOSIS — R300 Dysuria: Secondary | ICD-10-CM | POA: Diagnosis present

## 2018-11-23 DIAGNOSIS — F17210 Nicotine dependence, cigarettes, uncomplicated: Secondary | ICD-10-CM | POA: Diagnosis not present

## 2018-11-23 DIAGNOSIS — N390 Urinary tract infection, site not specified: Secondary | ICD-10-CM | POA: Diagnosis not present

## 2018-11-23 DIAGNOSIS — R350 Frequency of micturition: Secondary | ICD-10-CM | POA: Insufficient documentation

## 2018-11-23 LAB — URINALYSIS, ROUTINE W REFLEX MICROSCOPIC
Bilirubin Urine: NEGATIVE
Glucose, UA: NEGATIVE mg/dL
Hgb urine dipstick: NEGATIVE
Ketones, ur: NEGATIVE mg/dL
Nitrite: NEGATIVE
Protein, ur: NEGATIVE mg/dL
Specific Gravity, Urine: 1.025 (ref 1.005–1.030)
pH: 6 (ref 5.0–8.0)

## 2018-11-23 LAB — URINALYSIS, MICROSCOPIC (REFLEX)

## 2018-11-23 MED ORDER — CEPHALEXIN 500 MG PO CAPS: 500.0000 mg | ORAL_CAPSULE | Freq: Four times a day (QID) | ORAL | 0 refills | Status: DC

## 2018-11-23 NOTE — ED Provider Notes (Signed)
[facility] EMERGENCY DEPARTMENT Provider Note   CSN: [id number] Arrival date & time: 11/23/18  2028    History   Chief Complaint Chief Complaint  Patient presents with  . Dysuria    HPI Beth Best is a 43 y.o. female who presents to the ED today complaining of dysuria and urinary frequency that began today. Pt reports hx of recurrent UTIs and states this feels similar. She has not taken anything for her symptoms. Denies fever, chills, abdominal pain, nausea, vomiting, pelvic pain, vaginal discharge, vaginal bleeding, or any other associated symptoms.        Past Medical History:  Diagnosis Date  . Anxiety   . Bipolar 1 disorder ([place])   . Chronic back pain   . Heroin addiction ([place])    recovering  . Panic attacks   . PTSD (post-traumatic stress disorder)     Patient Active Problem List   Diagnosis Date Noted  . Thrombocytopenia ([place]) 09/21/2018  . Major depressive disorder, recurrent episode, moderate ([place]) 06/10/2015  . PTSD (post-traumatic stress disorder) 06/10/2015    Past Surgical History:  Procedure Laterality Date  . ABDOMINAL HYSTERECTOMY    . APPENDECTOMY    . CESAREAN SECTION    . CHOLECYSTECTOMY       OB History   No obstetric history on file.      Home Medications    Prior to Admission medications   Medication Sig Start Date End Date Taking? Authorizing Provider  busPIRone (BUSPAR) 10 MG tablet Take 10 mg by mouth 3 (three) times daily.    [provider]  cephALEXin (KEFLEX) 500 MG capsule Take 1 capsule (500 mg total) by mouth 4 (four) times daily. 11/23/18   Alroy Bailiff, Tonea Leiphart, PA-C  chlorhexidine (HIBICLENS) 4 % external liquid Apply topically daily as needed. Patient not taking: Reported on 09/21/2018 02/13/18   Horton, Barbette Hair, MD  ciprofloxacin (CIPRO) 500 MG tablet Take 1 tablet (500 mg total) by mouth 2 (two) times daily. One po bid x 7 days Patient not taking: Reported on 09/21/2018 06/01/18   Veryl Speak,  MD  doxycycline (VIBRA-TABS) 100 MG tablet Take 1 tablet (100 mg total) by mouth 2 (two) times daily. Patient not taking: Reported on 09/21/2018 07/23/15   Wendie Agreste, MD  FLUoxetine (PROZAC) 20 MG capsule Take 1 capsule (20 mg total) by mouth daily. Patient not taking: Reported on 09/21/2018 06/10/15   Patrecia Pour, NP  gabapentin (NEURONTIN) 300 MG capsule Take 300 mg by mouth 2 (two) times daily.    [provider]  HYDROcodone-acetaminophen (NORCO) 5-325 MG tablet Take 1-2 tablets by mouth every 6 (six) hours as needed. Patient not taking: Reported on 09/21/2018 06/01/18   Veryl Speak, MD  hydrOXYzine (ATARAX/VISTARIL) 25 MG tablet Take 1 tablet (25 mg total) by mouth every 6 (six) hours as needed for anxiety. Patient not taking: Reported on 09/21/2018 07/23/15   Wendie Agreste, MD  hydrOXYzine (ATARAX/VISTARIL) 25 MG tablet Take 1 tablet (25 mg total) by mouth every 6 (six) hours. Patient taking differently: Take 25 mg by mouth every 6 (six) hours as needed for anxiety.  02/13/18   Horton, Barbette Hair, MD  lamoTRIgine (LAMICTAL) 200 MG tablet Take 200 mg by mouth at bedtime.  06/22/18   [provider]  LORazepam (ATIVAN) 0.5 MG tablet Take 0.5 mg by mouth 3 (three) times daily as needed for anxiety.    [provider]  prazosin (MINIPRESS) 2 MG capsule Take 2  capsules (4 mg total) by mouth at bedtime. Patient taking differently: Take 2 mg by mouth at bedtime.  06/10/15   Charm RingsLord, Jamison Y, NP  QUEtiapine (SEROQUEL) 100 MG tablet Take 1 tablet (100 mg total) by mouth at bedtime. Patient taking differently: Take 100-150 mg by mouth at bedtime.  06/10/15   Charm RingsLord, Jamison Y, NP    Family History No family history on file.  Social History Social History   Tobacco Use  . Smoking status: Current Some Day Smoker    Packs/day: 0.50    Types: Cigarettes  Substance Use Topics  . Alcohol use: No  . Drug use: No     Allergies   Aripiprazole and Carbamazepine    Review of Systems Review of Systems  Constitutional: Negative for chills and fever.  Gastrointestinal: Negative for abdominal pain, nausea and vomiting.  Genitourinary: Positive for dysuria and frequency.     Physical Exam Updated Vital Signs BP (!) 144/108 (BP Location: Left Arm)   Pulse 93   Temp 98.6 F (37 C) (Oral)   Resp 20   Ht 5\' 6"  (1.676 m)   Wt 56.7 kg   SpO2 100%   BMI 20.18 kg/m   Physical Exam Vitals signs and nursing note reviewed.  Constitutional:      Appearance: She is not ill-appearing.  HENT:     Head: Normocephalic and atraumatic.  Eyes:     Conjunctiva/sclera: Conjunctivae normal.  Cardiovascular:     Rate and Rhythm: Normal rate and regular rhythm.  Pulmonary:     Effort: Pulmonary effort is normal.     Breath sounds: Normal breath sounds.  Abdominal:     Tenderness: There is no abdominal tenderness. There is no right CVA tenderness, left CVA tenderness, guarding or rebound.  Skin:    General: Skin is warm and dry.     Coloration: Skin is not jaundiced.  Neurological:     Mental Status: She is alert.      ED Treatments / Results  Labs (all labs ordered are listed, but only abnormal results are displayed) Labs Reviewed  URINALYSIS, ROUTINE W REFLEX MICROSCOPIC - Abnormal; Notable for the following components:      Result Value   APPearance HAZY (*)    Leukocytes,Ua TRACE (*)    All other components within normal limits  URINALYSIS, MICROSCOPIC (REFLEX) - Abnormal; Notable for the following components:   Bacteria, UA MANY (*)    All other components within normal limits  URINE CULTURE    EKG None  Radiology No results found.  Procedures Procedures (including critical care time)  Medications Ordered in ED Medications - No data to display   Initial Impression / Assessment and Plan / ED Course  I have reviewed the triage vital signs and the nursing notes.  Pertinent labs & imaging results that were available during my care  of the patient were reviewed by me and considered in my medical decision making (see chart for details).    43 year old female presenting to the ED with urinary frequency and dysuria. No other complaints at this time including abd pain, N/V, pelvic pain. No concern for pregnancy. PSHx includes hysterectomy. Pt without concerns for STD today. Reports recurrent UTIs and this feels very similar. She is without CVA tenderness on exam; low suspicion for pyelo. Pt afebrile in the ED without tachycardia or tachypnea. It appears pt has had pyelo in the past. Will obtain U/A today and reevaluate.   U/A with trace  leuks and 6-10 WBCs per HPF but no nitrites. Will send for culture and treat regardless for UTI as do not want this to worsen into pyelo. Pt advised to follow up with PCP. She is in agreement with plan at this time and stable for discharge home.        Final Clinical Impressions(s) / ED Diagnoses   Final diagnoses:  Lower urinary tract infectious disease    ED Discharge Orders         Ordered    cephALEXin (KEFLEX) 500 MG capsule  4 times daily     11/23/18 2130           Tanda RockersVenter, Shakiya Mcneary, PA-C 11/23/18 2141    Geoffery Lyonselo, [place], MD 11/23/18 2214

## 2018-11-23 NOTE — Discharge Instructions (Signed)
You were seen in the ED today for urinary frequency and burning You were found to have a UTI Please take antibiotics as prescribed Please follow up with your PCP. If you do not have one you can follow up with [facility] as needed for your primary care needs

## 2018-11-23 NOTE — ED Triage Notes (Signed)
Pt c/o dysuria and urine freq x today-NAD-steady gait

## 2018-11-26 LAB — URINE CULTURE: Culture: 70000 — AB

## 2018-11-27 ENCOUNTER — Telehealth: Payer: Self-pay | Admitting: *Deleted

## 2018-11-27 NOTE — Telephone Encounter (Signed)
Post ED Visit - Positive Culture Follow-up  Culture report reviewed by antimicrobial stewardship pharmacist: [place] Team []  Elenor Quinones, Pharm.D. []  Heide Guile, Pharm.D., BCPS AQ-ID []  Parks Neptune, Pharm.D., BCPS []  Alycia Rossetti, Pharm.D., BCPS []  [place], Pharm.D., BCPS, AAHIVP []  Legrand Como, Pharm.D., BCPS, AAHIVP [x]  Salome Arnt, PharmD, BCPS []  Johnnette Gourd, PharmD, BCPS []  Hughes Better, PharmD, BCPS []  Leeroy Cha, PharmD []  Laqueta Linden, PharmD, BCPS []  Albertina Parr, PharmD  [place] Team []  Leodis Sias, PharmD []  Lindell Spar, PharmD []  Royetta Asal, PharmD []  Graylin Shiver, Rph []  Rema Fendt) Glennon Mac, PharmD []  Arlyn Dunning, PharmD []  Netta Cedars, PharmD []  Dia Sitter, PharmD []  Leone Haven, PharmD []  Gretta Arab, PharmD []  Theodis Shove, PharmD []  Peggyann Juba, PharmD []  Reuel Boom, PharmD   Positive urine culture Treated with Cephalexin, organism sensitive to the same and no further patient follow-up is required at this time.  Harlon Flor [facility] 11/27/2018, 2:54 PM

## 2019-04-10 ENCOUNTER — Other Ambulatory Visit: Payer: Self-pay | Admitting: Physician Assistant

## 2019-04-10 DIAGNOSIS — Z1231 Encounter for screening mammogram for malignant neoplasm of breast: Secondary | ICD-10-CM

## 2019-05-31 ENCOUNTER — Ambulatory Visit: Payer: Medicare Other

## 2019-06-01 ENCOUNTER — Ambulatory Visit: Payer: Medicare Other

## 2019-07-05 ENCOUNTER — Ambulatory Visit: Payer: Medicaid Other

## 2021-02-15 ENCOUNTER — Emergency Department (HOSPITAL_BASED_OUTPATIENT_CLINIC_OR_DEPARTMENT_OTHER): Payer: Medicare Other

## 2021-02-15 ENCOUNTER — Encounter (HOSPITAL_BASED_OUTPATIENT_CLINIC_OR_DEPARTMENT_OTHER): Payer: Self-pay

## 2021-02-15 ENCOUNTER — Other Ambulatory Visit: Payer: Self-pay

## 2021-02-15 ENCOUNTER — Emergency Department (HOSPITAL_BASED_OUTPATIENT_CLINIC_OR_DEPARTMENT_OTHER)
Admission: EM | Admit: 2021-02-15 | Discharge: 2021-02-15 | Disposition: A | Discharge status: Home / Self Care | Payer: Medicare Other | Attending: Emergency Medicine | Admitting: Emergency Medicine

## 2021-02-15 DIAGNOSIS — W1842XA Slipping, tripping and stumbling without falling due to stepping into hole or opening, initial encounter: Secondary | ICD-10-CM | POA: Diagnosis not present

## 2021-02-15 DIAGNOSIS — Z79899 Other long term (current) drug therapy: Secondary | ICD-10-CM | POA: Insufficient documentation

## 2021-02-15 DIAGNOSIS — F17210 Nicotine dependence, cigarettes, uncomplicated: Secondary | ICD-10-CM | POA: Insufficient documentation

## 2021-02-15 DIAGNOSIS — S8992XA Unspecified injury of left lower leg, initial encounter: Secondary | ICD-10-CM | POA: Diagnosis present

## 2021-02-15 DIAGNOSIS — S8002XA Contusion of left knee, initial encounter: Secondary | ICD-10-CM | POA: Insufficient documentation

## 2021-02-15 DIAGNOSIS — S93492A Sprain of other ligament of left ankle, initial encounter: Secondary | ICD-10-CM

## 2021-02-15 DIAGNOSIS — M79672 Pain in left foot: Secondary | ICD-10-CM | POA: Diagnosis not present

## 2021-02-15 DIAGNOSIS — Y92480 Sidewalk as the place of occurrence of the external cause: Secondary | ICD-10-CM | POA: Diagnosis not present

## 2021-02-15 DIAGNOSIS — M25572 Pain in left ankle and joints of left foot: Secondary | ICD-10-CM | POA: Diagnosis not present

## 2021-02-15 MED ORDER — OXYCODONE-ACETAMINOPHEN 5-325 MG PO TABS
1.0000 | ORAL_TABLET | Freq: Once | ORAL | Status: AC
  Administered 2021-02-15: 1 via ORAL
  Filled 2021-02-15: qty 1

## 2021-02-15 MED ORDER — IBUPROFEN 800 MG PO TABS
800.0000 mg | ORAL_TABLET | Freq: Once | ORAL | Status: AC
  Administered 2021-02-15: 800 mg via ORAL
  Filled 2021-02-15: qty 1
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
## 2021-02-15 NOTE — Discharge Instructions (Signed)
You were seen in the ER today for ankle pain.  You have no broken bones in your ankle or your leg which is very reassuring.  You have a bad ankle sprain.  You been placed in a supportive brace to help with this as well as crutches.  You may alternate Tylenol and ibuprofen every 3 hours as needed at home for your pain.  Recommend you rest it, ice it, elevate it, and wear your compressive brace as needed for support.  May begin to bear weight on your ankle as tolerated in the next couple of days.  May follow-up with your primary care doctor or with Dr. Jordan Likes the sports medicine provider listed below as needed.  Return to the ER with any new numbness, tingling, weakness in your foot, or any other new severe symptom.

## 2021-02-15 NOTE — ED Provider Notes (Signed)
[facility] EMERGENCY DEPARTMENT Provider Note   CSN: [id number] Arrival date & time: 02/15/21  1342     History Chief Complaint  Patient presents with   Ankle Pain   Foot Pain    Beth Best is a 45 y.o. female who presents with concern for left foot and ankle pain after stepping in a hole began yesterday.  States that it is a mole adjacent to the sidewalk and she did not see it and stepped into it filling up to her left knee.  Has been limping on the leg since then.  Endorses that her foot is cold but denies any numbness or tingling.  I personally reviewed this patient's medical records.  She has history of mental health conditions and thrombocytopenia.  Patient has as needed Ativan prescription, taking Lamictal and gabapentin.  HPI     Past Medical History:  Diagnosis Date   Anxiety    Bipolar 1 disorder (HCC)    Chronic back pain    Heroin addiction (HCC)    recovering   Panic attacks    PTSD (post-traumatic stress disorder)     Patient Active Problem List   Diagnosis Date Noted   Thrombocytopenia (HCC) 09/21/2018   Major depressive disorder, recurrent episode, moderate (HCC) 06/10/2015   PTSD (post-traumatic stress disorder) 06/10/2015    Past Surgical History:  Procedure Laterality Date   ABDOMINAL HYSTERECTOMY     APPENDECTOMY     CESAREAN SECTION     CHOLECYSTECTOMY       OB History   No obstetric history on file.     No family history on file.  Social History   Tobacco Use   Smoking status: Some Days    Packs/day: 0.50    Types: Cigarettes  Vaping Use   Vaping Use: Never used  Substance Use Topics   Alcohol use: No   Drug use: No    Home Medications Prior to Admission medications   Medication Sig Start Date End Date Taking? Authorizing Provider  baclofen (LIORESAL) 10 MG tablet Take 10 mg by mouth 3 (three) times daily.   Yes [provider]  gabapentin (NEURONTIN) 300 MG capsule Take 300 mg by mouth 2 (two)  times daily.   Yes [provider]  lamoTRIgine (LAMICTAL) 200 MG tablet Take 200 mg by mouth at bedtime.  06/22/18  Yes [provider]  LORazepam (ATIVAN) 0.5 MG tablet Take 0.5 mg by mouth 3 (three) times daily as needed for anxiety.   Yes [provider]  prazosin (MINIPRESS) 2 MG capsule Take 2 capsules (4 mg total) by mouth at bedtime. Patient taking differently: Take 2 mg by mouth at bedtime. 06/10/15  Yes Charm Rings, NP  traZODone (DESYREL) 100 MG tablet Take 100 mg by mouth at bedtime.   Yes [provider]  busPIRone (BUSPAR) 10 MG tablet Take 10 mg by mouth 3 (three) times daily.    [provider]  cephALEXin (KEFLEX) 500 MG capsule Take 1 capsule (500 mg total) by mouth 4 (four) times daily. 11/23/18   Hyman Hopes, Margaux, PA-C  chlorhexidine (HIBICLENS) 4 % external liquid Apply topically daily as needed. Patient not taking: Reported on 09/21/2018 02/13/18   Horton, Mayer Masker, MD  ciprofloxacin (CIPRO) 500 MG tablet Take 1 tablet (500 mg total) by mouth 2 (two) times daily. One po bid x 7 days Patient not taking: Reported on 09/21/2018 06/01/18   Geoffery Lyons, MD  doxycycline (VIBRA-TABS) 100 MG tablet Take  1 tablet (100 mg total) by mouth 2 (two) times daily. Patient not taking: Reported on 09/21/2018 07/23/15   Shade Flood, MD  FLUoxetine (PROZAC) 20 MG capsule Take 1 capsule (20 mg total) by mouth daily. Patient not taking: Reported on 09/21/2018 06/10/15   Charm Rings, NP  HYDROcodone-acetaminophen (NORCO) 5-325 MG tablet Take 1-2 tablets by mouth every 6 (six) hours as needed. Patient not taking: Reported on 09/21/2018 06/01/18   Geoffery Lyons, MD  hydrOXYzine (ATARAX/VISTARIL) 25 MG tablet Take 1 tablet (25 mg total) by mouth every 6 (six) hours as needed for anxiety. Patient not taking: Reported on 09/21/2018 07/23/15   Shade Flood, MD  hydrOXYzine (ATARAX/VISTARIL) 25 MG tablet Take 1 tablet (25 mg total) by mouth every 6 (six)  hours. Patient taking differently: Take 25 mg by mouth every 6 (six) hours as needed for anxiety.  02/13/18   Horton, Mayer Masker, MD  QUEtiapine (SEROQUEL) 100 MG tablet Take 1 tablet (100 mg total) by mouth at bedtime. Patient taking differently: Take 100-150 mg by mouth at bedtime.  06/10/15   Charm Rings, NP    Allergies    Aripiprazole and Carbamazepine  Review of Systems   Review of Systems  Constitutional: Negative.   HENT: Negative.    Respiratory: Negative.    Cardiovascular: Negative.   Gastrointestinal: Negative.   Genitourinary: Negative.   Musculoskeletal:  Positive for arthralgias. Negative for myalgias, neck pain and neck stiffness.  Skin: Negative.   Neurological: Negative.    Physical Exam Updated Vital Signs BP 125/73 (BP Location: Right Arm)   Pulse 81   Temp 97.7 F (36.5 C) (Oral)   Resp 16   Ht 5\' 6"  (1.676 m)   Wt 59 kg   SpO2 99%   BMI 20.98 kg/m   Physical Exam Vitals and nursing note reviewed.  HENT:     Head: Normocephalic and atraumatic.  Eyes:     General: No scleral icterus.       Right eye: No discharge.        Left eye: No discharge.     Conjunctiva/sclera: Conjunctivae normal.  Cardiovascular:     Pulses:          Dorsalis pedis pulses are 2+ on the right side and 2+ on the left side.     Comments: Pulses confirmed with doppler Pulmonary:     Effort: Pulmonary effort is normal.  Musculoskeletal:     Right knee: Normal.     Left knee: Normal.     Right lower leg: Normal.     Left lower leg: Tenderness present.     Right ankle: Normal.     Right Achilles Tendon: Normal.     Left ankle: No lacerations. Tenderness present over the lateral malleolus. Normal range of motion.     Left Achilles Tendon: Normal.     Right foot: Normal.     Left foot: Normal range of motion and normal capillary refill. Tenderness present. Normal pulse.       Legs:  Skin:    General: Skin is warm and dry.     Capillary Refill: Capillary refill  takes less than 2 seconds.  Neurological:     General: No focal deficit present.     Mental Status: She is alert.     Sensory: Sensation is intact.     Motor: Motor function is intact.     Gait: Gait abnormal.     Comments: Antalgic gait  Psychiatric:        Mood and Affect: Mood normal.    ED Results / Procedures / Treatments   Labs (all labs ordered are listed, but only abnormal results are displayed) Labs Reviewed - No data to display  EKG None  Radiology DG Tibia/Fibula Left  Result Date: 02/15/2021 CLINICAL DATA:  Patient tripped causing injury to the left ankle/foot. EXAM: LEFT TIBIA AND FIBULA - 2 VIEW COMPARISON:  None. FINDINGS: There is no evidence of fracture or other focal bone lesions. Soft tissues are unremarkable. IMPRESSION: Negative. Electronically Signed   By: Emmaline Kluver M.D.   On: 02/15/2021 15:00   DG Foot Complete Left  Result Date: 02/15/2021 CLINICAL DATA:  Pt reports tripping in a hole in the ground yesterday afternoon, causing injury to L ankle/foot. EXAM: LEFT FOOT - COMPLETE 3+ VIEW COMPARISON:  None. FINDINGS: There is no evidence of fracture or dislocation. Postsurgical changes in the great toe. There is no destructive osseous lesion. Regional soft tissues are unremarkable. IMPRESSION: No acute osseous abnormality. Electronically Signed   By: Emmaline Kluver M.D.   On: 02/15/2021 15:03    Procedures Procedures   Medications Ordered in ED Medications  ibuprofen (ADVIL) tablet 800 mg (800 mg Oral Given 02/15/21 1421)    ED Course  I have reviewed the triage vital signs and the nursing notes.  Pertinent labs & imaging results that were available during my care of the patient were reviewed by me and considered in my medical decision making (see chart for details).    MDM Rules/Calculators/A&P                         45 year old female who presents with concern for left foot and ankle pain after stepping in a hold yesterday.    Differential diagnosis includes but is limited to acute fracture dislocation, ligamentous or tendinous injury, contusion.  Vital signs are normal on intake.  Physical exam with tenderness palpation over the left tibial tuberosity with mild bruising.  Normal range of motion of the left knee.  Intact Achilles tendon on the left but tenderness palpation over the lateral malleolus and  AITFL and a ATFL.  Normal cap refill.  Normal pedal pulses.  Plain films revealed no acute osseous abnormality.  Suspect acute ankle sprain as source of patient's pain in addition to contusion of the proximal tibia.  Will offer brace for support and crutches for comfort.  May bear weight as tolerated.  No further work-up warranted needed at this time.  May follow-up with her primary care doctor.  We will also provide contact information for sports medicine follow-up should she require it in the future.  [place]y voiced understanding of her medical evaluation and treatment plan.  Each of her questions was answered to her expressed satisfaction.  Return precautions were given.  Patient is well-appearing, stable, and appropriate for discharge at this time.  This chart was dictated using voice recognition software, Dragon. Despite the best efforts of this provider to proofread and correct errors, errors may still occur which can change documentation meaning.  Final Clinical Impression(s) / ED Diagnoses Final diagnoses:  None    Rx / DC Orders ED Discharge Orders     None        Paris Lore, PA-C 02/15/21 1510    Melene Plan, DO 02/16/21 [phone]

## 2021-08-14 ENCOUNTER — Other Ambulatory Visit: Payer: Self-pay

## 2021-08-14 ENCOUNTER — Encounter (HOSPITAL_BASED_OUTPATIENT_CLINIC_OR_DEPARTMENT_OTHER): Payer: Self-pay | Admitting: Urology

## 2021-08-14 ENCOUNTER — Emergency Department (HOSPITAL_BASED_OUTPATIENT_CLINIC_OR_DEPARTMENT_OTHER): Payer: Medicare HMO

## 2021-08-14 ENCOUNTER — Emergency Department (HOSPITAL_BASED_OUTPATIENT_CLINIC_OR_DEPARTMENT_OTHER)
Admission: EM | Admit: 2021-08-14 | Discharge: 2021-08-14 | Disposition: A | Discharge status: Home / Self Care | Payer: Medicare HMO | Attending: Emergency Medicine | Admitting: Emergency Medicine

## 2021-08-14 DIAGNOSIS — R109 Unspecified abdominal pain: Secondary | ICD-10-CM

## 2021-08-14 DIAGNOSIS — N200 Calculus of kidney: Secondary | ICD-10-CM | POA: Diagnosis not present

## 2021-08-14 DIAGNOSIS — M5136 Other intervertebral disc degeneration, lumbar region: Secondary | ICD-10-CM | POA: Insufficient documentation

## 2021-08-14 DIAGNOSIS — R1032 Left lower quadrant pain: Secondary | ICD-10-CM | POA: Diagnosis present

## 2021-08-14 DIAGNOSIS — D72821 Monocytosis (symptomatic): Secondary | ICD-10-CM | POA: Insufficient documentation

## 2021-08-14 DIAGNOSIS — E8809 Other disorders of plasma-protein metabolism, not elsewhere classified: Secondary | ICD-10-CM | POA: Diagnosis not present

## 2021-08-14 DIAGNOSIS — E871 Hypo-osmolality and hyponatremia: Secondary | ICD-10-CM | POA: Insufficient documentation

## 2021-08-14 LAB — COMPREHENSIVE METABOLIC PANEL
ALT: 14 U/L (ref 0–44)
AST: 18 U/L (ref 15–41)
Albumin: 3.4 g/dL — ABNORMAL LOW (ref 3.5–5.0)
Alkaline Phosphatase: 81 U/L (ref 38–126)
Anion gap: 7 (ref 5–15)
BUN: 11 mg/dL (ref 6–20)
CO2: 29 mmol/L (ref 22–32)
Calcium: 8.8 mg/dL — ABNORMAL LOW (ref 8.9–10.3)
Chloride: 98 mmol/L (ref 98–111)
Creatinine, Ser: 0.79 mg/dL (ref 0.44–1.00)
GFR, Estimated: >60 mL/min (ref >60)
Glucose, Bld: 84 mg/dL (ref 70–99)
Potassium: 4.3 mmol/L (ref 3.5–5.1)
Sodium: 134 mmol/L — ABNORMAL LOW (ref 135–145)
Total Bilirubin: 0.5 mg/dL (ref 0.3–1.2)
Total Protein: 7.2 g/dL (ref 6.5–8.1)

## 2021-08-14 LAB — URINALYSIS, MICROSCOPIC (REFLEX)

## 2021-08-14 LAB — CBC WITH DIFFERENTIAL/PLATELET
Abs Immature Granulocytes: 0.02 10*3/uL (ref 0.00–0.07)
Basophils Absolute: 0 10*3/uL (ref 0.0–0.1)
Basophils Relative: 1 %
Eosinophils Absolute: 0.2 10*3/uL (ref 0.0–0.5)
Eosinophils Relative: 2 %
HCT: 36.5 % (ref 36.0–46.0)
Hemoglobin: 12.2 g/dL (ref 12.0–15.0)
Immature Granulocytes: 0 %
Lymphocytes Relative: 28 %
Lymphs Abs: 2.3 10*3/uL (ref 0.7–4.0)
MCH: 30.4 pg (ref 26.0–34.0)
MCHC: 33.4 g/dL (ref 30.0–36.0)
MCV: 91 fL (ref 80.0–100.0)
Monocytes Absolute: 1.1 10*3/uL — ABNORMAL HIGH (ref 0.1–1.0)
Monocytes Relative: 13 %
Neutro Abs: 4.8 10*3/uL (ref 1.7–7.7)
Neutrophils Relative %: 56 %
Platelets: 244 10*3/uL (ref 150–400)
RBC: 4.01 MIL/uL (ref 3.87–5.11)
RDW: 12 % (ref 11.5–15.5)
WBC: 8.4 10*3/uL (ref 4.0–10.5)
nRBC: 0 % (ref 0.0–0.2)

## 2021-08-14 LAB — URINALYSIS, ROUTINE W REFLEX MICROSCOPIC
Bilirubin Urine: NEGATIVE
Glucose, UA: NEGATIVE mg/dL
Ketones, ur: NEGATIVE mg/dL
Leukocytes,Ua: NEGATIVE
Nitrite: NEGATIVE
Protein, ur: NEGATIVE mg/dL
Specific Gravity, Urine: 1.025 (ref 1.005–1.030)
pH: 5.5 (ref 5.0–8.0)

## 2021-08-14 MED ORDER — CEPHALEXIN 250 MG PO CAPS: 250.0000 mg | ORAL_CAPSULE | Freq: Four times a day (QID) | ORAL | 0 refills | Status: AC

## 2021-08-14 MED ORDER — KETOROLAC TROMETHAMINE 30 MG/ML IJ SOLN
30.0000 mg | Freq: Once | INTRAMUSCULAR | Status: AC
  Administered 2021-08-14: 30 mg via INTRAMUSCULAR
  Filled 2021-08-14: qty 1

## 2021-08-14 MED ORDER — METHOCARBAMOL 500 MG PO TABS: 500.0000 mg | ORAL_TABLET | Freq: Two times a day (BID) | ORAL | 0 refills | Status: AC

## 2021-08-14 MED ORDER — NAPROXEN 500 MG PO TABS: 500.0000 mg | ORAL_TABLET | Freq: Two times a day (BID) | ORAL | 0 refills | Status: AC

## 2021-08-14 NOTE — ED Notes (Signed)
Discharge paperwork given and understood. 

## 2021-08-14 NOTE — ED Provider Notes (Signed)
?[facility] EMERGENCY DEPARTMENT ?Provider Note ? ? ?CSN: [id number] ?Arrival date & time: 08/14/21  1553 ? ?  ? ?History ?PMH: Opiate use disorder, PTDS, frequent UTI/Pyeloinfections ?Chief Complaint  ?Patient presents with  ? Flank Pain  ? ? ?Beth Best is a 46 y.o. female. ? ?Presents the emergency department with a chief complaint of left flank pain.  She states that this started about 1 week ago.  She has associated dysuria.  She says flank pain radiates into the left lower quadrant of her abdomen.  Pain is constant.  It is worse when she goes to the bathroom.  She has associated hematuria feels like she is emptying her bladder all the way.  She denies any fevers but did have a episode of chills the other night.  She denies any vaginal discharge, or vaginal bleeding.  She has a history of a total hysterectomy.  Her abdominal surgeries.  Denies any diarrhea or constipation.  No nausea or vomiting.  No history of kidney stones. ? ?Flank Pain ?Associated symptoms include abdominal pain.  ? ?  ? ?Home Medications ?Prior to Admission medications   ?Medication Sig Start Date End Date Taking? Authorizing Provider  ?cephALEXin (KEFLEX) 250 MG capsule Take 1 capsule (250 mg total) by mouth 4 (four) times daily for 5 days. 08/14/21 08/19/21 Yes Rice Walsh, Adora Fridge, PA-C  ?methocarbamol (ROBAXIN) 500 MG tablet Take 1 tablet (500 mg total) by mouth 2 (two) times daily for 7 days. 08/14/21 08/21/21 Yes Maggi Hershkowitz, Adora Fridge, PA-C  ?naproxen (NAPROSYN) 500 MG tablet Take 1 tablet (500 mg total) by mouth 2 (two) times daily for 7 days. 08/14/21 08/21/21 Yes Shereena Berquist, Adora Fridge, PA-C  ?baclofen (LIORESAL) 10 MG tablet Take 10 mg by mouth 3 (three) times daily.    [provider]  ?busPIRone (BUSPAR) 10 MG tablet Take 10 mg by mouth 3 (three) times daily.    [provider]  ?chlorhexidine (HIBICLENS) 4 % external liquid Apply topically daily as needed. ?Patient not taking: Reported on 09/21/2018 02/13/18    Horton, Barbette Hair, MD  ?FLUoxetine (PROZAC) 20 MG capsule Take 1 capsule (20 mg total) by mouth daily. ?Patient not taking: Reported on 09/21/2018 06/10/15   Patrecia Pour, NP  ?gabapentin (NEURONTIN) 300 MG capsule Take 300 mg by mouth 2 (two) times daily.    [provider]  ?HYDROcodone-acetaminophen (NORCO) 5-325 MG tablet Take 1-2 tablets by mouth every 6 (six) hours as needed. ?Patient not taking: Reported on 09/21/2018 06/01/18   Veryl Speak, MD  ?hydrOXYzine (ATARAX/VISTARIL) 25 MG tablet Take 1 tablet (25 mg total) by mouth every 6 (six) hours as needed for anxiety. ?Patient not taking: Reported on 09/21/2018 07/23/15   Wendie Agreste, MD  ?hydrOXYzine (ATARAX/VISTARIL) 25 MG tablet Take 1 tablet (25 mg total) by mouth every 6 (six) hours. ?Patient taking differently: Take 25 mg by mouth every 6 (six) hours as needed for anxiety.  02/13/18   Horton, Barbette Hair, MD  ?lamoTRIgine (LAMICTAL) 200 MG tablet Take 200 mg by mouth at bedtime.  06/22/18   [provider]  ?LORazepam (ATIVAN) 0.5 MG tablet Take 0.5 mg by mouth 3 (three) times daily as needed for anxiety.    [provider]  ?prazosin (MINIPRESS) 2 MG capsule Take 2 capsules (4 mg total) by mouth at bedtime. ?Patient taking differently: Take 2 mg by mouth at bedtime. 06/10/15   Patrecia Pour, NP  ?QUEtiapine (SEROQUEL) 100 MG tablet Take 1 tablet (  100 mg total) by mouth at bedtime. ?Patient taking differently: Take 100-150 mg by mouth at bedtime.  06/10/15   Patrecia Pour, NP  ?traZODone (DESYREL) 100 MG tablet Take 100 mg by mouth at bedtime.    [provider]  ?   ? ?Allergies    ?Aripiprazole and Carbamazepine   ? ?Review of Systems   ?Review of Systems  ?Constitutional:  Positive for chills. Negative for fever.  ?Gastrointestinal:  Positive for abdominal pain. Negative for rectal pain and vomiting.  ?Genitourinary:  Positive for dysuria, flank pain and hematuria. Negative for vaginal discharge.  ?All other  systems reviewed and are negative. ? ?Physical Exam ?Updated Vital Signs ?BP 112/69 (BP Location: Right Arm)   Pulse 87   Temp 98.3 ?F (36.8 ?C) (Oral)   Resp 18   Ht 5\' 6"  (1.676 m)   Wt 59 kg   SpO2 97%   BMI 20.98 kg/m?  ?Physical Exam ?Vitals and nursing note reviewed.  ?Constitutional:   ?   General: She is not in acute distress. ?   Appearance: Normal appearance. She is well-developed. She is not ill-appearing, toxic-appearing or diaphoretic.  ?HENT:  ?   Head: Normocephalic and atraumatic.  ?   Nose: No nasal deformity.  ?   Mouth/Throat:  ?   Lips: Pink. No lesions.  ?Eyes:  ?   General: Gaze aligned appropriately. No scleral icterus.    ?   Right eye: No discharge.     ?   Left eye: No discharge.  ?   Conjunctiva/sclera: Conjunctivae normal.  ?   Right eye: Right conjunctiva is not injected. No exudate or hemorrhage. ?   Left eye: Left conjunctiva is not injected. No exudate or hemorrhage. ?Pulmonary:  ?   Effort: Pulmonary effort is normal. No respiratory distress.  ?Abdominal:  ?   General: Abdomen is flat. There is no distension.  ?   Palpations: Abdomen is soft.  ?   Tenderness: There is abdominal tenderness. There is left CVA tenderness. There is no right CVA tenderness, guarding or rebound.  ?   Comments: Moderate left CVA tenderness and LLQ  ?Skin: ?   General: Skin is warm and dry.  ?Neurological:  ?   Mental Status: She is alert and oriented to person, place, and time.  ?Psychiatric:     ?   Mood and Affect: Mood normal.     ?   Speech: Speech normal.     ?   Behavior: Behavior normal. Behavior is cooperative.  ? ? ?ED Results / Procedures / Treatments   ?Labs ?(all labs ordered are listed, but only abnormal results are displayed) ?Labs Reviewed  ?URINALYSIS, ROUTINE W REFLEX MICROSCOPIC - Abnormal; Notable for the following components:  ?    Result Value  ? APPearance HAZY (*)   ? Hgb urine dipstick TRACE (*)   ? All other components within normal limits  ?COMPREHENSIVE METABOLIC PANEL -  Abnormal; Notable for the following components:  ? Sodium 134 (*)   ? Calcium 8.8 (*)   ? Albumin 3.4 (*)   ? All other components within normal limits  ?CBC WITH DIFFERENTIAL/PLATELET - Abnormal; Notable for the following components:  ? Monocytes Absolute 1.1 (*)   ? All other components within normal limits  ?URINALYSIS, MICROSCOPIC (REFLEX) - Abnormal; Notable for the following components:  ? Bacteria, UA FEW (*)   ? All other components within normal limits  ?URINE CULTURE  ? ? ?EKG ?None ? ?  Radiology ?CT Renal Stone Study ? ?Result Date: 08/14/2021 ?CLINICAL DATA:  Left flank pain EXAM: CT ABDOMEN AND PELVIS WITHOUT CONTRAST TECHNIQUE: Multidetector CT imaging of the abdomen and pelvis was performed following the standard protocol without IV contrast. RADIATION DOSE REDUCTION: This exam was performed according to the departmental dose-optimization program which includes automated exposure control, adjustment of the mA and/or kV according to patient size and/or use of iterative reconstruction technique. COMPARISON:  CT examination dated May 23, 2015 FINDINGS: Lower chest: No acute abnormality. Hepatobiliary: No focal liver abnormality is seen. Status post cholecystectomy. No biliary dilatation. Pancreas: Unremarkable. No pancreatic ductal dilatation or surrounding inflammatory changes. Spleen: Normal in size without focal abnormality. Adrenals/Urinary Tract: Adrenal glands are unremarkable. Punctate nonobstructing calculi in the lower pole of the right kidney. 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 No evidence of hydronephrosis or ureteral calculus. Bladder is unremarkable. Stomach/Bowel: Stomach is within normal limits. Appendix not identified. No evidence of bowel wall thickening, distention, or inflammatory changes. Vascular/Lymphatic: No significant vascular findings are present. No enlarged abdominal or pelvic lymph nodes. Reproductive: Status post hysterectomy. No adnexal  masses. Other: No abdominal wall hernia or abnormality. No abdominopelvic ascites. Musculoskeletal: Mild degenerate disc disease of the lumbar spine. IMPRESSION: 1. Punctate nonobstructing calculi in the lower po

## 2021-08-14 NOTE — Discharge Instructions (Addendum)
Please pick up antibiotics from pharmacy. Please return here if you do not see any improvement in symptoms or if they acutely worsen.  ? ?I have also prescribed you Robaxin and Naproxen to use for one week. ?You were given a prescription for Robaxin which is a muscle relaxer.  You should not drive, work, consume alcohol, or operate machinery while taking this medication as it can make you very drowsy.   ? ?You can also call Dr. Jordan Likes for a follow up visit to evaluate for a musculoskeletal cause to your pain. Please return if you develop fevers, inability to control your bowel or bladder, numbness in your groin area, or inability to walk or control your legs.  ? ?

## 2021-08-14 NOTE — ED Triage Notes (Signed)
Left sided flank pain over past week, worsening today, pain radiating into pelvis ?Some hematuria noted  ?Frequency and urgency  ? ?

## 2021-08-16 LAB — URINE CULTURE: Culture: <10000 — AB
